# Patient Record
Sex: Female | Born: 1960 | Race: White | Hispanic: No | Marital: Single | Smoking: Current every day smoker
Health system: Southern US, Community
[De-identification: ages and names within clinical notes are randomized; demographics above are authoritative.]

## PROBLEM LIST (undated history)

## (undated) DIAGNOSIS — N393 Stress incontinence (female) (male): Secondary | ICD-10-CM

## (undated) DIAGNOSIS — F419 Anxiety disorder, unspecified: Secondary | ICD-10-CM

## (undated) DIAGNOSIS — M1990 Unspecified osteoarthritis, unspecified site: Secondary | ICD-10-CM

## (undated) HISTORY — DX: Anxiety disorder, unspecified: F41.9

## (undated) HISTORY — PX: JOINT REPLACEMENT: SHX530

## (undated) HISTORY — PX: TUBAL LIGATION: SHX77

## (undated) HISTORY — DX: Unspecified osteoarthritis, unspecified site: M19.90

---

## 2006-07-16 ENCOUNTER — Ambulatory Visit: Payer: Self-pay

## 2008-01-04 ENCOUNTER — Emergency Department: Payer: Self-pay

## 2012-06-24 ENCOUNTER — Ambulatory Visit: Payer: Self-pay

## 2012-06-30 ENCOUNTER — Ambulatory Visit: Payer: Self-pay

## 2013-12-01 ENCOUNTER — Emergency Department: Payer: Self-pay | Admitting: Emergency Medicine

## 2014-05-26 ENCOUNTER — Ambulatory Visit
Admit: 2014-05-26 | Disposition: A | Discharge status: Home / Self Care | Payer: Self-pay | Attending: Orthopedic Surgery | Admitting: Orthopedic Surgery

## 2015-06-29 ENCOUNTER — Ambulatory Visit: Payer: BLUE CROSS/BLUE SHIELD | Admitting: Internal Medicine

## 2015-06-29 DIAGNOSIS — Z0289 Encounter for other administrative examinations: Secondary | ICD-10-CM

## 2015-07-11 ENCOUNTER — Ambulatory Visit (INDEPENDENT_AMBULATORY_CARE_PROVIDER_SITE_OTHER): Payer: BLUE CROSS/BLUE SHIELD | Admitting: Internal Medicine

## 2015-07-11 ENCOUNTER — Encounter: Payer: Self-pay | Admitting: Internal Medicine

## 2015-07-11 VITALS — BP 120/74 | HR 80 | Temp 98.2°F | Ht 60.5 in | Wt 192.5 lb

## 2015-07-11 DIAGNOSIS — F411 Generalized anxiety disorder: Secondary | ICD-10-CM | POA: Insufficient documentation

## 2015-07-11 DIAGNOSIS — M170 Bilateral primary osteoarthritis of knee: Secondary | ICD-10-CM

## 2015-07-11 DIAGNOSIS — M129 Arthropathy, unspecified: Secondary | ICD-10-CM | POA: Diagnosis not present

## 2015-07-11 MED ORDER — BUSPIRONE HCL 7.5 MG PO TABS: 7.5000 mg | ORAL_TABLET | Freq: Three times a day (TID) | ORAL | Status: DC

## 2015-07-11 MED ORDER — MELOXICAM 15 MG PO TABS: 15.0000 mg | ORAL_TABLET | Freq: Every day | ORAL | Status: DC

## 2015-07-11 NOTE — Progress Notes (Signed)
HPI  Pt presents to the clinic today to establish care and for management of the conditions listed below. She is transferring care from Dr. Sammie Bench.  Arthritis: Mainly in her knees. She takes Meloxicam daily. 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 She has seen Dr. Rudene Christians in the past. He did xrays of her knees 1 month ago. She has had multiple cortisone injection in her knees bilaterally. He advised her to take Tylenol 1000 mg TID.  Anxiety: Triggered by stress of daily living, dealing with people every day. She reports her son was in an accident in 2003, and he is paralyzed from the waist down. She takes Xanax 1-2 times daily. She has never been treated with anything else in the past. She did see a counselor many years ago. She depression, SI/HI.  Past Medical History  Diagnosis Date  . Arthritis   . Anxiety     Current Outpatient Prescriptions  Medication Sig Dispense Refill  . acetaminophen (TYLENOL ARTHRITIS PAIN) 650 MG CR tablet Take 1,950 mg by mouth every 8 (eight) hours as needed for pain.    Marland Kitchen ALPRAZolam (XANAX) 1 MG tablet Take 1 mg by mouth 2 (two) times daily.   3  . Glucosamine-Chondroitin (OSTEO BI-FLEX REGULAR STRENGTH PO) Take 1 tablet by mouth daily.    . meloxicam (MOBIC) 15 MG tablet Take 15 mg by mouth daily.   4  . oxyCODONE-acetaminophen (PERCOCET/ROXICET) 5-325 MG tablet Take 1 tablet by mouth every 4 (four) hours as needed for severe pain.    Marland Kitchen ibuprofen (ADVIL,MOTRIN) 800 MG tablet Take 800 mg by mouth as needed. Reported on 07/11/2015  3   No current facility-administered medications for this visit.    No Known Allergies  Family History  Problem Relation Age of Onset  . Diabetes Father     Social History   Social History  . Marital Status: Single    Spouse Name: N/A  . Number of Children: N/A  . Years of Education: N/A   Occupational History  . Not on file.   Social History Main Topics  . Smoking status: Current Every Day  Smoker -- 0.50 packs/day    Types: Cigarettes  . Smokeless tobacco: Never Used  . Alcohol Use: No  . Drug Use: No  . Sexual Activity: Not on file   Other Topics Concern  . Not on file   Social History Narrative  . No narrative on file    ROS:  Constitutional: Denies fever, malaise, fatigue, headache or abrupt weight changes.  HEENT: Denies eye pain, eye redness, ear pain, ringing in the ears, wax buildup, runny nose, nasal congestion, bloody nose, or sore throat. Respiratory: Denies difficulty breathing, shortness of breath, cough or sputum production.   Cardiovascular: Denies chest pain, chest tightness, palpitations or swelling in the hands or feet.  Gastrointestinal: Denies abdominal pain, bloating, constipation, diarrhea or blood in the stool.  GU: Denies frequency, urgency, pain with urination, blood in urine, odor or discharge. Musculoskeletal: Pt reports joint pain. Denies decrease in range of motion, difficulty with gait, muscle pain or joint swelling.  Skin: Denies redness, rashes, lesions or ulcercations.  Neurological: Denies dizziness, difficulty with memory, difficulty with speech or problems with balance and coordination.  Psych: Pt reports anxiety. Denies depression, SI/HI.  No other specific complaints in a complete review of systems (except as listed in HPI above).  PE:  BP 120/74 mmHg  Pulse 80  Temp(Src) 98.2 F (36.8  C) (Oral)  Ht 5' 0.5" (1.537 m)  Wt 192 lb 8 oz (87.317 kg)  BMI 36.96 kg/m2  SpO2 97% Wt Readings from Last 3 Encounters:  07/11/15 192 lb 8 oz (87.317 kg)    General: Appears her stated age, obese in NAD. Cardiovascular: Normal rate and rhythm. S1,S2 noted.  No murmur, rubs or gallops noted.  Pulmonary/Chest: Normal effort and positive vesicular breath sounds. No respiratory distress. No wheezes, rales or ronchi noted.  Musculoskeletal:  Strength 5/5 BLE. No signs of joint swelling. No difficulty with gait.  Neurological: Alert and  oriented.  Psychiatric: Mood and affect flat.   Assessment and Plan:

## 2015-07-11 NOTE — Patient Instructions (Signed)
Generalized Anxiety Disorder Generalized anxiety disorder (GAD) is a mental disorder. It interferes with life functions, including relationships, work, and school. GAD is different from normal anxiety, which everyone experiences at some point in their lives in response to specific life events and activities. Normal anxiety actually helps us prepare for and get through these life events and activities. Normal anxiety goes away after the event or activity is over.  GAD causes anxiety that is not necessarily related to specific events or activities. It also causes excess anxiety in proportion to specific events or activities. The anxiety associated with GAD is also difficult to control. GAD can vary from mild to severe. People with severe GAD can have intense waves of anxiety with physical symptoms (panic attacks).  SYMPTOMS The anxiety and worry associated with GAD are difficult to control. This anxiety and worry are related to many life events and activities and also occur more days than not for 6 months or longer. People with GAD also have three or more of the following symptoms (one or more in children):  Restlessness.   Fatigue.  Difficulty concentrating.   Irritability.  Muscle tension.  Difficulty sleeping or unsatisfying sleep. DIAGNOSIS GAD is diagnosed through an assessment by your health care provider. Your health care provider will ask you questions aboutyour mood,physical symptoms, and events in your life. Your health care provider may ask you about your medical history and use of alcohol or drugs, including prescription medicines. Your health care provider may also do a physical exam and blood tests. Certain medical conditions and the use of certain substances can cause symptoms similar to those associated with GAD. Your health care provider may refer you to a mental health specialist for further evaluation. TREATMENT The following therapies are usually used to treat GAD:    Medication. Antidepressant medication usually is prescribed for long-term daily control. Antianxiety medicines may be added in severe cases, especially when panic attacks occur.   Talk therapy (psychotherapy). Certain types of talk therapy can be helpful in treating GAD by providing support, education, and guidance. A form of talk therapy called cognitive behavioral therapy can teach you healthy ways to think about and react to daily life events and activities.  Stress managementtechniques. These include yoga, meditation, and exercise and can be very helpful when they are practiced regularly. A mental health specialist can help determine which treatment is best for you. Some people see improvement with one therapy. However, other people require a combination of therapies.   This information is not intended to replace advice given to you by your health care provider. Make sure you discuss any questions you have with your health care provider.   Document Released: 06/08/2012 Document Revised: 03/04/2014 Document Reviewed: 06/08/2012 Elsevier Interactive Patient Education 2016 [employer] Inc[employer]  

## 2015-07-11 NOTE — Assessment & Plan Note (Signed)
She is following with Dr. Rudene Christians Advised her I would not give her a RX for Percocet Meloxicam refilled today Continue Tylenol as needed

## 2015-07-11 NOTE — Assessment & Plan Note (Signed)
Discussed the standard treatment for GAD Weaning schedule given for Xanax, I will not be providing her with a RX for Xanax eRx for Buspar 7.5 mg TID Support offered today  RTC in 1 month, sooner if needed

## 2015-07-12 ENCOUNTER — Telehealth: Payer: Self-pay | Admitting: *Deleted

## 2015-07-12 ENCOUNTER — Telehealth: Payer: Self-pay | Admitting: Internal Medicine

## 2015-07-12 NOTE — Telephone Encounter (Signed)
Pt is aware as instructed 

## 2015-07-12 NOTE — Telephone Encounter (Signed)
Spoke with pt, informed that her paper chart from Dr. Hardin Negus is ready for pickup per PCP

## 2015-07-12 NOTE — Telephone Encounter (Signed)
Spoke to pt who is requesting a call back discuss how she is to begin taking buspar. She is aware of how to wean off of xanax, but is questioning if she is supposed to take them together

## 2015-07-12 NOTE — Telephone Encounter (Signed)
Yes, she is supposed to take them together.

## 2015-07-21 ENCOUNTER — Telehealth: Payer: Self-pay

## 2015-07-21 NOTE — Telephone Encounter (Signed)
Have her cut it down to 1 tab every night x 1 week, then increase to 1 tab morning and night x 1 week and let me know how she is doing at that point.

## 2015-07-21 NOTE — Telephone Encounter (Signed)
Pt left v/m; pt was seen 07/11/15; buspirone is still making pt dizzy and pt wants to know what to do? Hyman Hopes.

## 2015-07-21 NOTE — Telephone Encounter (Signed)
Left message on voicemail.

## 2015-08-08 ENCOUNTER — Ambulatory Visit: Payer: BLUE CROSS/BLUE SHIELD | Admitting: Internal Medicine

## 2015-08-10 ENCOUNTER — Ambulatory Visit: Payer: BLUE CROSS/BLUE SHIELD | Admitting: Internal Medicine

## 2015-09-01 ENCOUNTER — Other Ambulatory Visit: Payer: Self-pay | Admitting: Adult Health

## 2015-09-01 DIAGNOSIS — Z1231 Encounter for screening mammogram for malignant neoplasm of breast: Secondary | ICD-10-CM

## 2015-09-13 ENCOUNTER — Other Ambulatory Visit: Payer: Self-pay | Admitting: Adult Health

## 2015-09-13 DIAGNOSIS — M545 Low back pain, unspecified: Secondary | ICD-10-CM

## 2015-09-13 DIAGNOSIS — G8929 Other chronic pain: Secondary | ICD-10-CM

## 2015-09-19 ENCOUNTER — Ambulatory Visit
Admission: RE | Admit: 2015-09-19 | Discharge: 2015-09-19 | Disposition: A | Discharge status: Home / Self Care | Payer: BLUE CROSS/BLUE SHIELD | Source: Ambulatory Visit | Attending: Adult Health | Admitting: Adult Health

## 2015-09-19 DIAGNOSIS — Z1231 Encounter for screening mammogram for malignant neoplasm of breast: Secondary | ICD-10-CM

## 2015-09-23 ENCOUNTER — Ambulatory Visit
Admission: RE | Admit: 2015-09-23 | Discharge: 2015-09-23 | Disposition: A | Discharge status: Home / Self Care | Payer: BLUE CROSS/BLUE SHIELD | Source: Ambulatory Visit | Attending: Adult Health | Admitting: Adult Health

## 2015-09-23 DIAGNOSIS — G8929 Other chronic pain: Secondary | ICD-10-CM

## 2015-09-23 DIAGNOSIS — M545 Low back pain, unspecified: Secondary | ICD-10-CM

## 2015-12-15 ENCOUNTER — Ambulatory Visit: Admit: 2015-12-15 | Payer: BLUE CROSS/BLUE SHIELD | Admitting: Unknown Physician Specialty

## 2015-12-15 SURGERY — COLONOSCOPY WITH PROPOFOL
Anesthesia: General

## 2016-02-27 ENCOUNTER — Encounter: Payer: Self-pay | Admitting: *Deleted

## 2016-02-28 ENCOUNTER — Ambulatory Visit: Payer: BLUE CROSS/BLUE SHIELD | Admitting: Certified Registered"

## 2016-02-28 ENCOUNTER — Encounter
Admission: RE | Disposition: A | Discharge status: Home / Self Care | Payer: Self-pay | Source: Ambulatory Visit | Attending: Unknown Physician Specialty

## 2016-02-28 ENCOUNTER — Ambulatory Visit
Admission: RE | Admit: 2016-02-28 | Discharge: 2016-02-28 | Disposition: A | Discharge status: Home / Self Care | Payer: BLUE CROSS/BLUE SHIELD | Source: Ambulatory Visit | Attending: Unknown Physician Specialty | Admitting: Unknown Physician Specialty

## 2016-02-28 ENCOUNTER — Encounter: Payer: Self-pay | Admitting: *Deleted

## 2016-02-28 DIAGNOSIS — F419 Anxiety disorder, unspecified: Secondary | ICD-10-CM | POA: Diagnosis not present

## 2016-02-28 DIAGNOSIS — D125 Benign neoplasm of sigmoid colon: Secondary | ICD-10-CM | POA: Diagnosis not present

## 2016-02-28 DIAGNOSIS — Z79899 Other long term (current) drug therapy: Secondary | ICD-10-CM | POA: Insufficient documentation

## 2016-02-28 DIAGNOSIS — F17210 Nicotine dependence, cigarettes, uncomplicated: Secondary | ICD-10-CM | POA: Insufficient documentation

## 2016-02-28 DIAGNOSIS — K219 Gastro-esophageal reflux disease without esophagitis: Secondary | ICD-10-CM | POA: Insufficient documentation

## 2016-02-28 DIAGNOSIS — J449 Chronic obstructive pulmonary disease, unspecified: Secondary | ICD-10-CM | POA: Diagnosis not present

## 2016-02-28 DIAGNOSIS — K621 Rectal polyp: Secondary | ICD-10-CM | POA: Diagnosis not present

## 2016-02-28 DIAGNOSIS — M1990 Unspecified osteoarthritis, unspecified site: Secondary | ICD-10-CM | POA: Diagnosis not present

## 2016-02-28 DIAGNOSIS — K640 First degree hemorrhoids: Secondary | ICD-10-CM | POA: Diagnosis not present

## 2016-02-28 DIAGNOSIS — Z1211 Encounter for screening for malignant neoplasm of colon: Secondary | ICD-10-CM | POA: Insufficient documentation

## 2016-02-28 HISTORY — DX: Stress incontinence (female) (male): N39.3

## 2016-02-28 HISTORY — PX: COLONOSCOPY WITH PROPOFOL: SHX5780

## 2016-02-28 SURGERY — COLONOSCOPY WITH PROPOFOL
Anesthesia: General

## 2016-02-28 MED ORDER — PROPOFOL 10 MG/ML IV BOLUS
INTRAVENOUS | Status: DC | PRN
  Administered 2016-02-28: 50 mg via INTRAVENOUS

## 2016-02-28 MED ORDER — LIDOCAINE HCL (CARDIAC) 20 MG/ML IV SOLN
INTRAVENOUS | Status: DC | PRN
  Administered 2016-02-28: 100 mg via INTRAVENOUS

## 2016-02-28 MED ORDER — LIDOCAINE 2% (20 MG/ML) 5 ML SYRINGE
INTRAMUSCULAR | Status: AC
  Filled 2016-02-28: qty 5

## 2016-02-28 MED ORDER — PROPOFOL 500 MG/50ML IV EMUL
INTRAVENOUS | Status: AC
  Filled 2016-02-28: qty 50

## 2016-02-28 MED ORDER — PROPOFOL 500 MG/50ML IV EMUL
INTRAVENOUS | Status: DC | PRN
  Administered 2016-02-28: 150 ug/kg/min via INTRAVENOUS

## 2016-02-28 MED ORDER — SODIUM CHLORIDE 0.9 % IV SOLN
INTRAVENOUS | Status: DC
  Administered 2016-02-28: 1000 mL via INTRAVENOUS
  Administered 2016-02-28 (×2): via INTRAVENOUS

## 2016-02-28 MED ORDER — SODIUM CHLORIDE 0.9 % IV SOLN: INTRAVENOUS | Status: DC

## 2016-02-28 MED ORDER — PROPOFOL 10 MG/ML IV BOLUS
INTRAVENOUS | Status: AC
  Filled 2016-02-28: qty 20

## 2016-02-28 NOTE — Anesthesia Preprocedure Evaluation (Signed)
Anesthesia Evaluation  Patient identified by MRN, date of birth, ID band Patient awake    Reviewed: Allergy & Precautions, H&P , NPO status , Patient's Chart, lab work & pertinent test results  History of Anesthesia Complications Negative for: history of anesthetic complications  Airway Mallampati: III  TM Distance: >3 FB Neck ROM: full    Dental  (+) Poor Dentition, Chipped, Missing, Partial Upper   Pulmonary neg shortness of breath, COPD, Current Smoker,    Pulmonary exam normal breath sounds clear to auscultation       Cardiovascular Exercise Tolerance: Good (-) angina(-) Past MI and (-) DOE negative cardio ROS Normal cardiovascular exam Rhythm:regular Rate:Normal     Neuro/Psych PSYCHIATRIC DISORDERS Anxiety negative neurological ROS     GI/Hepatic negative GI ROS, Neg liver ROS, neg GERD  ,  Endo/Other  negative endocrine ROS  Renal/GU negative Renal ROS  negative genitourinary   Musculoskeletal  (+) Arthritis ,   Abdominal   Peds  Hematology negative hematology ROS (+)   Anesthesia Other Findings Past Medical History: No date: Anxiety No date: Arthritis No date: Stress incontinence  Past Surgical History: No date: TUBAL LIGATION  BMI    Body Mass Index:  34.02 kg/m      Reproductive/Obstetrics negative OB ROS                             Anesthesia Physical Anesthesia Plan  ASA: III  Anesthesia Plan: General   Post-op Pain Management:    Induction:   Airway Management Planned:   Additional Equipment:   Intra-op Plan:   Post-operative Plan:   Informed Consent: I have reviewed the patients History and Physical, chart, labs and discussed the procedure including the risks, benefits and alternatives for the proposed anesthesia with the patient or authorized representative who has indicated his/her understanding and acceptance.   Dental Advisory Given  Plan  Discussed with: Anesthesiologist, CRNA and Surgeon  Anesthesia Plan Comments:         Anesthesia Quick Evaluation

## 2016-02-28 NOTE — Transfer of Care (Signed)
Immediate Anesthesia Transfer of Care Note  Patient: Victoria Bautista  Procedure(s) Performed: Procedure(s): COLONOSCOPY WITH PROPOFOL (N/A)  Patient Location: Endoscopy Unit  Anesthesia Type:General  Level of Consciousness: awake, oriented and patient cooperative  Airway & Oxygen Therapy: Patient Spontanous Breathing and Patient connected to nasal cannula oxygen  Post-op Assessment: Report given to RN, Post -op Vital signs reviewed and stable and Patient moving all extremities X 4  Post vital signs: Reviewed and stable  Last Vitals:  Vitals:   02/28/16 1014 02/28/16 1110  BP: (!) 110/96   Pulse: 86   Resp: 20   Temp: 36.1 C (P) 36.2 C    Last Pain:  Vitals:   02/28/16 1110  TempSrc: (P) Tympanic         Complications: No apparent anesthesia complications

## 2016-02-28 NOTE — Op Note (Signed)
[facility] Patient Name: Victoria Bautista Procedure Date: 02/28/2016 10:41 AM MRN: [id number] Account #: [id number] Date of Birth: 10-25-1960 Admit Type: Outpatient Age: 56 Room: [facility] ENDO ROOM 4 Gender: Female Note Status: Finalized Procedure:            Colonoscopy Indications:          Screening for colorectal malignant neoplasm Providers:            Manya Silvas, MD Referring MD:         Alvester Chou (Referring MD) Medicines:            Propofol per Anesthesia Complications:        No immediate complications. Procedure:            Pre-Anesthesia Assessment:                       - After reviewing the risks and benefits, the patient                        was deemed in satisfactory condition to undergo the                        procedure.                       After obtaining informed consent, the colonoscope was                        passed under direct vision. Throughout the procedure,                        the patient's blood pressure, pulse, and oxygen                        saturations were monitored continuously. The                        Colonoscope was introduced through the anus and                        advanced to the the cecum, identified by appendiceal                        orifice and ileocecal valve. The colonoscopy was                        performed without difficulty. The patient tolerated the                        procedure well. The quality of the bowel preparation                        was excellent. Findings:      A small polyp was found in the sigmoid colon. The polyp was sessile. The       polyp was removed with a hot snare. Resection and retrieval were       complete.      Three sessile polyps were found in the rectum. The polyps were       diminutive in size. These polyps were removed with a jumbo cold forceps.  Resection and retrieval were complete.      Internal hemorrhoids were found during endoscopy.  The hemorrhoids were       small, medium-sized and Grade I (internal hemorrhoids that do not       prolapse).      The exam was otherwise without abnormality.      External hemorrhoids were found during endoscopy. The hemorrhoids were       medium-sized. Impression:           - One small polyp in the sigmoid colon, removed with a                        hot snare. Resected and retrieved.                       - Three diminutive polyps in the rectum, removed with a                        jumbo cold forceps. Resected and retrieved.                       - Internal hemorrhoids.                       - The examination was otherwise normal. Recommendation:       - Await pathology results. Manya Silvas, MD 02/28/2016 11:13:23 AM This report has been signed electronically. Number of Addenda: 0 Note Initiated On: 02/28/2016 10:41 AM Scope Withdrawal Time: 0 hours 12 minutes 46 seconds  Total Procedure Duration: 0 hours 19 minutes 39 seconds       [facility]

## 2016-02-28 NOTE — H&P (Signed)
Primary Care Physician:  PROVIDER NOT Long Primary Gastroenterologist:  Dr. Vira Agar  Pre-Procedure History & Physical: HPI:  Victoria Bautista is a 56 y.o. female is here for an colonoscopy.   Past Medical History:  Diagnosis Date  . Anxiety   . Arthritis   . Stress incontinence     Past Surgical History:  Procedure Laterality Date  . TUBAL LIGATION      Prior to Admission medications   Medication Sig Start Date End Date Taking? Authorizing Provider  ALPRAZolam Duanne Moron) 1 MG tablet Take 1 mg by mouth 2 (two) times daily.  06/14/15  Yes Historical Provider, MD  cyclobenzaprine (FLEXERIL) 10 MG tablet Take 10 mg by mouth 3 (three) times daily as needed for muscle spasms.   Yes Historical Provider, MD  Multiple Vitamin (MULTIVITAMIN) tablet Take 1 tablet by mouth daily.   Yes Historical Provider, MD  OMEGA 3-6-9 FATTY ACIDS PO Take 1 capsule by mouth daily.   Yes Historical Provider, MD  oxyCODONE-acetaminophen (PERCOCET) 7.5-325 MG tablet Take 1 tablet by mouth every 4 (four) hours as needed for severe pain.   Yes Historical Provider, MD  acetaminophen (TYLENOL ARTHRITIS PAIN) 650 MG CR tablet Take 1,950 mg by mouth every 8 (eight) hours as needed for pain.    Historical Provider, MD  Glucosamine-Chondroitin (OSTEO BI-FLEX REGULAR STRENGTH PO) Take 1 tablet by mouth daily.    Historical Provider, MD  ibuprofen (ADVIL,MOTRIN) 800 MG tablet Take 800 mg by mouth as needed. Reported on 07/11/2015 07/06/15   Historical Provider, MD  meloxicam (MOBIC) 15 MG tablet Take 1 tablet (15 mg total) by mouth daily. 07/11/15   Jearld Fenton, NP    Allergies as of 10/19/2015  . (No Known Allergies)    Family History  Problem Relation Age of Onset  . Diabetes Father   . Stroke Father   . Cancer Neg Hx   . Heart disease Neg Hx     Social History   Social History  . Marital status: Single    Spouse name: N/A  . Number of children: N/A  . Years of education: N/A   Occupational History  .  Not on file.   Social History Main Topics  . Smoking status: Current Every Day Smoker    Packs/day: 0.50    Years: 35.00    Types: Cigarettes  . Smokeless tobacco: Never Used  . Alcohol use No  . Drug use: No  . Sexual activity: Not Currently   Other Topics Concern  . Not on file   Social History Narrative  . No narrative on file    Review of Systems: See HPI, otherwise negative ROS  Physical Exam: BP (!) 110/96   Pulse 86   Temp 97 F (36.1 C) (Tympanic)   Resp 20   Ht 5' 1.5" (1.562 m)   Wt 83 kg (183 lb)   SpO2 95%   BMI 34.02 kg/m  General:   Alert,  pleasant and cooperative in NAD Head:  Normocephalic and atraumatic. Neck:  Supple; no masses or thyromegaly. Lungs:  Clear throughout to auscultation.    Heart:  Regular rate and rhythm. Abdomen:  Soft, nontender and nondistended. Normal bowel sounds, without guarding, and without rebound.   Neurologic:  Alert and  oriented x4;  grossly normal neurologically.  Impression/Plan: Victoria Bautista is here for an colonoscopy to be performed for colon screening  Risks, benefits, limitations, and alternatives regarding  colonoscopy have been reviewed with the patient.  Questions have been answered.  All parties agreeable.   Gaylyn Cheers, MD  02/28/2016, 10:38 AM

## 2016-02-28 NOTE — Anesthesia Postprocedure Evaluation (Signed)
Anesthesia Post Note  Patient: Victoria Bautista  Procedure(s) Performed: Procedure(s) (LRB): COLONOSCOPY WITH PROPOFOL (N/A)  Patient location during evaluation: Endoscopy Anesthesia Type: General Level of consciousness: awake and alert Pain management: pain level controlled Vital Signs Assessment: post-procedure vital signs reviewed and stable Respiratory status: spontaneous breathing, nonlabored ventilation, respiratory function stable and patient connected to nasal cannula oxygen Cardiovascular status: blood pressure returned to baseline and stable Postop Assessment: no signs of nausea or vomiting Anesthetic complications: no     Last Vitals:  Vitals:   02/28/16 1110 02/28/16 1120  BP: 111/72 111/72  Pulse:    Resp:    Temp: 36.2 C     Last Pain:  Vitals:   02/28/16 1130  TempSrc:   PainSc: 0-No pain                 Precious Haws Germain Koopmann

## 2016-02-29 ENCOUNTER — Encounter: Payer: Self-pay | Admitting: Unknown Physician Specialty

## 2016-02-29 LAB — SURGICAL PATHOLOGY

## 2016-04-24 ENCOUNTER — Other Ambulatory Visit: Payer: Self-pay | Admitting: Orthopedic Surgery

## 2016-04-24 DIAGNOSIS — M25461 Effusion, right knee: Secondary | ICD-10-CM

## 2016-04-24 DIAGNOSIS — M25561 Pain in right knee: Secondary | ICD-10-CM

## 2016-06-12 ENCOUNTER — Encounter
Admission: RE | Admit: 2016-06-12 | Discharge: 2016-06-12 | Disposition: A | Discharge status: Home / Self Care | Payer: BLUE CROSS/BLUE SHIELD | Source: Ambulatory Visit | Attending: Orthopedic Surgery | Admitting: Orthopedic Surgery

## 2016-06-12 NOTE — Patient Instructions (Signed)
  Your procedure is scheduled on: 06-20-16 Report to Same Day Surgery 2nd floor medical mall [facility] Entrance-take elevator on left to 2nd floor.  Check in with surgery information desk.) To find out your arrival time please call [phone] between 1PM - 3PM on 06-19-16  Remember: Instructions that are not followed completely may result in serious medical risk, up to and including death, or upon the discretion of your surgeon and anesthesiologist your surgery may need to be rescheduled.    _x___ 1. Do not eat food or drink liquids after midnight. No gum chewing or                              hard candies.     __x__ 2. No Alcohol for 24 hours before or after surgery.   __x__3. No Smoking for 24 prior to surgery.   ____  4. Bring all medications with you on the day of surgery if instructed.    __x__ 5. Notify your doctor if there is any change in your medical condition     (cold, fever, infections).     Do not wear jewelry, make-up, hairpins, clips or nail polish.  Do not wear lotions, powders, or perfumes. You may wear deodorant.  Do not shave 48 hours prior to surgery. Men may shave face and neck.  Do not bring valuables to the hospital.    [facility] is not responsible for any belongings or valuables.               Contacts, dentures or bridgework may not be worn into surgery.  Leave your suitcase in the car. After surgery it may be brought to your room.  For patients admitted to the hospital, discharge time is determined by your                       treatment team.   Patients discharged the day of surgery will not be allowed to drive home.  You will need someone to drive you home and stay with you the night of your procedure.    Please read over the following fact sheets that you were given:   [facility] Preparing for Surgery and or MRSA Information   _x___ Take anti-hypertensive (unless it includes a diuretic), cardiac, seizure, asthma,     anti-reflux and psychiatric  medicines. These include:  1. XANAX  2.  3.  4.  5.  6.  ____Fleets enema or Magnesium Citrate as directed.   ____ Use CHG Soap or sage wipes as directed on instruction sheet   ____ Use inhalers on the day of surgery and bring to hospital day of surgery  ____ Stop Metformin and Janumet 2 days prior to surgery.    ____ Take 1/2 of usual insulin dose the night before surgery and none on the morning     surgery.   ____ Follow recommendations from Cardiologist, Pulmonologist or PCP regarding          stopping Aspirin, Coumadin, Pllavix ,Eliquis, Effient, or Pradaxa, and Pletal.  X____Stop Anti-inflammatories such as Advil, Aleve, Ibuprofen, Motrin, Naproxen, MELOXICAM Naprosyn, Goodies powders or aspirin products NOW-OK to take Tylenol OR HYDROCODONE   _x___ Stop supplements until after surgery-STOP FISH OIL, GLUCOSAMINE-CHONDROITIN, VIT E, CO-Q 10,  AND CRANBERRY NOW    ____ Bring C-Pap to the hospital.

## 2016-06-19 MED ORDER — CEFAZOLIN SODIUM-DEXTROSE 2-4 GM/100ML-% IV SOLN
2.0000 g | Freq: Once | INTRAVENOUS | Status: AC
Start: 2016-06-19 — End: 2016-06-20
  Administered 2016-06-20: 2 g via INTRAVENOUS

## 2016-06-19 MED ORDER — FAMOTIDINE 20 MG PO TABS
20.0000 mg | ORAL_TABLET | Freq: Once | ORAL | Status: AC
  Administered 2016-06-20: 20 mg via ORAL

## 2016-06-20 ENCOUNTER — Encounter
Admission: RE | Disposition: A | Discharge status: Home / Self Care | Payer: Self-pay | Source: Ambulatory Visit | Attending: Orthopedic Surgery

## 2016-06-20 ENCOUNTER — Ambulatory Visit: Payer: BLUE CROSS/BLUE SHIELD | Admitting: Anesthesiology

## 2016-06-20 ENCOUNTER — Ambulatory Visit
Admission: RE | Admit: 2016-06-20 | Discharge: 2016-06-20 | Disposition: A | Discharge status: Home / Self Care | Payer: BLUE CROSS/BLUE SHIELD | Source: Ambulatory Visit | Attending: Orthopedic Surgery | Admitting: Orthopedic Surgery

## 2016-06-20 ENCOUNTER — Encounter: Payer: Self-pay | Admitting: *Deleted

## 2016-06-20 DIAGNOSIS — Z791 Long term (current) use of non-steroidal anti-inflammatories (NSAID): Secondary | ICD-10-CM | POA: Insufficient documentation

## 2016-06-20 DIAGNOSIS — S83231A Complex tear of medial meniscus, current injury, right knee, initial encounter: Secondary | ICD-10-CM | POA: Diagnosis not present

## 2016-06-20 DIAGNOSIS — F419 Anxiety disorder, unspecified: Secondary | ICD-10-CM | POA: Insufficient documentation

## 2016-06-20 DIAGNOSIS — F17200 Nicotine dependence, unspecified, uncomplicated: Secondary | ICD-10-CM | POA: Insufficient documentation

## 2016-06-20 DIAGNOSIS — X58XXXA Exposure to other specified factors, initial encounter: Secondary | ICD-10-CM | POA: Insufficient documentation

## 2016-06-20 DIAGNOSIS — M659 Synovitis and tenosynovitis, unspecified: Secondary | ICD-10-CM | POA: Insufficient documentation

## 2016-06-20 DIAGNOSIS — M23300 Other meniscus derangements, unspecified lateral meniscus, right knee: Secondary | ICD-10-CM | POA: Diagnosis not present

## 2016-06-20 DIAGNOSIS — Z79899 Other long term (current) drug therapy: Secondary | ICD-10-CM | POA: Diagnosis not present

## 2016-06-20 DIAGNOSIS — M170 Bilateral primary osteoarthritis of knee: Secondary | ICD-10-CM | POA: Diagnosis not present

## 2016-06-20 DIAGNOSIS — M25561 Pain in right knee: Secondary | ICD-10-CM | POA: Diagnosis present

## 2016-06-20 HISTORY — PX: KNEE ARTHROSCOPY: SHX127

## 2016-06-20 LAB — POCT PREGNANCY, URINE: PREG TEST UR: NEGATIVE

## 2016-06-20 SURGERY — ARTHROSCOPY, KNEE
Anesthesia: General | Site: Knee | Laterality: Right | Wound class: Clean

## 2016-06-20 MED ORDER — HYDROCODONE-ACETAMINOPHEN 5-325 MG PO TABS
1.0000 | ORAL_TABLET | ORAL | Status: DC | PRN
  Administered 2016-06-20: 2 via ORAL

## 2016-06-20 MED ORDER — CEFAZOLIN SODIUM-DEXTROSE 2-4 GM/100ML-% IV SOLN
INTRAVENOUS | Status: AC
  Filled 2016-06-20: qty 100

## 2016-06-20 MED ORDER — ACETAMINOPHEN 10 MG/ML IV SOLN
INTRAVENOUS | Status: AC
  Filled 2016-06-20: qty 100

## 2016-06-20 MED ORDER — PROPOFOL 10 MG/ML IV BOLUS
INTRAVENOUS | Status: DC | PRN
Start: 2016-06-20 — End: 2016-06-20
  Administered 2016-06-20: 180 mg via INTRAVENOUS
  Administered 2016-06-20: 20 mg via INTRAVENOUS

## 2016-06-20 MED ORDER — ONDANSETRON HCL 4 MG/2ML IJ SOLN
INTRAMUSCULAR | Status: DC | PRN
Start: 2016-06-20 — End: 2016-06-20
  Administered 2016-06-20: 4 mg via INTRAVENOUS

## 2016-06-20 MED ORDER — FENTANYL CITRATE (PF) 100 MCG/2ML IJ SOLN
INTRAMUSCULAR | Status: AC
  Administered 2016-06-20: 25 ug via INTRAVENOUS
  Filled 2016-06-20: qty 2

## 2016-06-20 MED ORDER — MIDAZOLAM HCL 2 MG/2ML IJ SOLN
INTRAMUSCULAR | Status: AC
  Filled 2016-06-20: qty 2

## 2016-06-20 MED ORDER — ONDANSETRON HCL 4 MG/2ML IJ SOLN: 4.0000 mg | Freq: Four times a day (QID) | INTRAMUSCULAR | Status: DC | PRN

## 2016-06-20 MED ORDER — HYDROCODONE-ACETAMINOPHEN 5-325 MG PO TABS
ORAL_TABLET | ORAL | Status: AC
  Administered 2016-06-20: 2 via ORAL
  Filled 2016-06-20: qty 2

## 2016-06-20 MED ORDER — HYDROCODONE-ACETAMINOPHEN 5-325 MG PO TABS: 1.0000 | ORAL_TABLET | Freq: Four times a day (QID) | ORAL | 0 refills | Status: DC | PRN

## 2016-06-20 MED ORDER — DEXAMETHASONE SODIUM PHOSPHATE 10 MG/ML IJ SOLN
INTRAMUSCULAR | Status: DC | PRN
  Administered 2016-06-20: 8 mg via INTRAVENOUS

## 2016-06-20 MED ORDER — METOCLOPRAMIDE HCL 5 MG/ML IJ SOLN: 5.0000 mg | Freq: Three times a day (TID) | INTRAMUSCULAR | Status: DC | PRN

## 2016-06-20 MED ORDER — BUPIVACAINE-EPINEPHRINE (PF) 0.5% -1:200000 IJ SOLN
INTRAMUSCULAR | Status: DC | PRN
  Administered 2016-06-20: 20 mL

## 2016-06-20 MED ORDER — ACETAMINOPHEN 10 MG/ML IV SOLN
INTRAVENOUS | Status: DC | PRN
  Administered 2016-06-20: 1000 mg via INTRAVENOUS

## 2016-06-20 MED ORDER — PROPOFOL 10 MG/ML IV BOLUS
INTRAVENOUS | Status: AC
  Filled 2016-06-20: qty 20

## 2016-06-20 MED ORDER — SODIUM CHLORIDE 0.9 % IV SOLN: INTRAVENOUS | Status: DC

## 2016-06-20 MED ORDER — FENTANYL CITRATE (PF) 100 MCG/2ML IJ SOLN
INTRAMUSCULAR | Status: AC
  Filled 2016-06-20: qty 2

## 2016-06-20 MED ORDER — FAMOTIDINE 20 MG PO TABS
ORAL_TABLET | ORAL | Status: AC
Start: 2016-06-20 — End: 2016-06-20
  Administered 2016-06-20: 20 mg via ORAL
  Filled 2016-06-20: qty 1

## 2016-06-20 MED ORDER — LIDOCAINE HCL 2 % EX GEL
CUTANEOUS | Status: AC
  Filled 2016-06-20: qty 5

## 2016-06-20 MED ORDER — ONDANSETRON HCL 4 MG PO TABS: 4.0000 mg | ORAL_TABLET | Freq: Four times a day (QID) | ORAL | Status: DC | PRN

## 2016-06-20 MED ORDER — FENTANYL CITRATE (PF) 100 MCG/2ML IJ SOLN
25.0000 ug | INTRAMUSCULAR | Status: DC
  Administered 2016-06-20 (×2): 25 ug via INTRAVENOUS

## 2016-06-20 MED ORDER — LIDOCAINE HCL (CARDIAC) 20 MG/ML IV SOLN
INTRAVENOUS | Status: DC | PRN
  Administered 2016-06-20: 40 mg via INTRAVENOUS

## 2016-06-20 MED ORDER — BUPIVACAINE-EPINEPHRINE (PF) 0.5% -1:200000 IJ SOLN
INTRAMUSCULAR | Status: AC
  Filled 2016-06-20: qty 30

## 2016-06-20 MED ORDER — METOCLOPRAMIDE HCL 10 MG PO TABS: 5.0000 mg | ORAL_TABLET | Freq: Three times a day (TID) | ORAL | Status: DC | PRN

## 2016-06-20 MED ORDER — FENTANYL CITRATE (PF) 100 MCG/2ML IJ SOLN
25.0000 ug | INTRAMUSCULAR | Status: AC | PRN
  Administered 2016-06-20 (×6): 25 ug via INTRAVENOUS

## 2016-06-20 MED ORDER — FENTANYL CITRATE (PF) 100 MCG/2ML IJ SOLN
INTRAMUSCULAR | Status: DC | PRN
  Administered 2016-06-20 (×4): 50 ug via INTRAVENOUS

## 2016-06-20 MED ORDER — MIDAZOLAM HCL 2 MG/2ML IJ SOLN
INTRAMUSCULAR | Status: DC | PRN
  Administered 2016-06-20: 2 mg via INTRAVENOUS

## 2016-06-20 MED ORDER — PROPOFOL 10 MG/ML IV BOLUS: INTRAVENOUS | Status: DC | PRN

## 2016-06-20 MED ORDER — LACTATED RINGERS IV SOLN
INTRAVENOUS | Status: DC
  Administered 2016-06-20 (×2): via INTRAVENOUS

## 2016-06-20 MED ORDER — PROMETHAZINE HCL 25 MG/ML IJ SOLN
6.2500 mg | INTRAMUSCULAR | Status: DC | PRN
Start: 2016-06-20 — End: 2016-06-20

## 2016-06-20 SURGICAL SUPPLY — 26 items
BANDAGE ACE 4X5 VEL STRL LF (GAUZE/BANDAGES/DRESSINGS) ×3 IMPLANT
BLADE INCISOR PLUS 4.5 (BLADE) ×3 IMPLANT
CHLORAPREP W/TINT 26ML (MISCELLANEOUS) ×3 IMPLANT
CUFF TOURN 24 STER (MISCELLANEOUS) IMPLANT
CUFF TOURN 30 STER DUAL PORT (MISCELLANEOUS) ×3 IMPLANT
DRAPE C-ARM XRAY 36X54 (DRAPES) IMPLANT
DRAPE C-ARMOR (DRAPES) IMPLANT
GAUZE SPONGE 4X4 12PLY STRL (GAUZE/BANDAGES/DRESSINGS) ×3 IMPLANT
GLOVE SURG SYN 9.0  PF PI (GLOVE) ×4
GLOVE SURG SYN 9.0 PF PI (GLOVE) ×2 IMPLANT
GOWN SRG 2XL LVL 4 RGLN SLV (GOWNS) ×1 IMPLANT
GOWN STRL NON-REIN 2XL LVL4 (GOWNS) ×2
GOWN STRL REUS W/ TWL LRG LVL3 (GOWN DISPOSABLE) ×1 IMPLANT
GOWN STRL REUS W/TWL LRG LVL3 (GOWN DISPOSABLE) ×2
IV LACTATED RINGER IRRG 3000ML (IV SOLUTION) ×4
IV LR IRRIG 3000ML ARTHROMATIC (IV SOLUTION) ×2 IMPLANT
KIT RM TURNOVER STRD PROC AR (KITS) ×3 IMPLANT
MANIFOLD NEPTUNE II (INSTRUMENTS) ×3 IMPLANT
PACK ARTHROSCOPY KNEE (MISCELLANEOUS) ×3 IMPLANT
SET TUBE SUCT SHAVER OUTFL 24K (TUBING) ×3 IMPLANT
SET TUBE TIP INTRA-ARTICULAR (MISCELLANEOUS) ×3 IMPLANT
SUT ETHILON 4-0 (SUTURE) ×2
SUT ETHILON 4-0 FS2 18XMFL BLK (SUTURE) ×1
SUTURE ETHLN 4-0 FS2 18XMF BLK (SUTURE) ×1 IMPLANT
TUBING ARTHRO INFLOW-ONLY STRL (TUBING) ×3 IMPLANT
WAND COBLATION FLOW 50 (SURGICAL WAND) ×3 IMPLANT

## 2016-06-20 NOTE — Anesthesia Preprocedure Evaluation (Signed)
Anesthesia Evaluation  Patient identified by MRN, date of birth, ID band Patient awake    Reviewed: Allergy & Precautions, H&P , NPO status , Patient's Chart, lab work & pertinent test results, reviewed documented beta blocker date and time   History of Anesthesia Complications Negative for: history of anesthetic complications  Airway Mallampati: II  TM Distance: >3 FB Neck ROM: full    Dental  (+) Partial Upper, Teeth Intact   Pulmonary neg shortness of breath, neg sleep apnea, neg COPD, neg recent URI, Current Smoker,           Cardiovascular Exercise Tolerance: Good negative cardio ROS       Neuro/Psych PSYCHIATRIC DISORDERS (Anxiety) negative neurological ROS     GI/Hepatic negative GI ROS, Neg liver ROS,   Endo/Other  negative endocrine ROS  Renal/GU negative Renal ROS  negative genitourinary   Musculoskeletal   Abdominal   Peds  Hematology negative hematology ROS (+)   Anesthesia Other Findings Past Medical History: No date: Anxiety No date: Arthritis No date: Stress incontinence   Reproductive/Obstetrics negative OB ROS                             Anesthesia Physical Anesthesia Plan  ASA: II  Anesthesia Plan: General   Post-op Pain Management:    Induction:   Airway Management Planned:   Additional Equipment:   Intra-op Plan:   Post-operative Plan:   Informed Consent: I have reviewed the patients History and Physical, chart, labs and discussed the procedure including the risks, benefits and alternatives for the proposed anesthesia with the patient or authorized representative who has indicated his/her understanding and acceptance.   Dental Advisory Given  Plan Discussed with: Anesthesiologist, CRNA and Surgeon  Anesthesia Plan Comments:         Anesthesia Quick Evaluation

## 2016-06-20 NOTE — Anesthesia Procedure Notes (Signed)
Procedure Name: LMA Insertion Date/Time: 06/20/2016 8:35 AM Performed by: Allean Found Pre-anesthesia Checklist: Patient identified, Emergency Drugs available, Suction available, Patient being monitored and Timeout performed Patient Re-evaluated:Patient Re-evaluated prior to inductionOxygen Delivery Method: Circle system utilized Preoxygenation: Pre-oxygenation with 100% oxygen Intubation Type: IV induction Ventilation: Mask ventilation without difficulty LMA: LMA inserted LMA Size: 4.0 Number of attempts: 1 Placement Confirmation: ETT inserted through vocal cords under direct vision,  positive ETCO2 and breath sounds checked- equal and bilateral Tube secured with: Tape Dental Injury: Teeth and Oropharynx as per pre-operative assessment

## 2016-06-20 NOTE — Anesthesia Post-op Follow-up Note (Cosign Needed)
Anesthesia QCDR form completed.        

## 2016-06-20 NOTE — Transfer of Care (Signed)
Immediate Anesthesia Transfer of Care Note  Patient: Victoria Bautista  Procedure(s) Performed: Procedure(s): right knee arthroscopy, partial and lateral menisectomy, medial femoral chondroplasty, loose body removal (Right)  Patient Location: PACU  Anesthesia Type:General  Level of Consciousness: sedated  Airway & Oxygen Therapy: Patient Spontanous Breathing and Patient connected to face mask oxygen  Post-op Assessment: Report given to RN and Post -op Vital signs reviewed and stable  Post vital signs: Reviewed and stable  Last Vitals:  Vitals:   06/20/16 0736 06/20/16 0932  BP: 134/80 134/82  Pulse: 83 67  Resp: 18 11  Temp: 36.8 C 36.5 C    Last Pain:  Vitals:   06/20/16 0736  TempSrc: Oral  PainSc: 4          Complications: No apparent anesthesia complications

## 2016-06-20 NOTE — Discharge Instructions (Addendum)
Keep dressing clean and dry, if bandage slides down the leg remove entire bandage. Band-Aids on the 2 incisions and reapply Ace wrap only. Pain medicine as directed. Aspirin 325 mg daily until walking normally. Minimize activities through the weekend     [place]   1) The drugs that you were given will stay in your system until tomorrow so for the next 24 hours you should not:  A) Drive an automobile B) Make any legal decisions C) Drink any alcoholic beverage   2) You may resume regular meals tomorrow.  Today it is better to start with liquids and gradually work up to solid foods.  You may eat anything you prefer, but it is better to start with liquids, then soup and crackers, and gradually work up to solid foods.   3) Please notify your doctor immediately if you have any unusual bleeding, trouble breathing, redness and pain at the surgery site, drainage, fever, or pain not relieved by medication.    4) Additional Instructions:        Please contact your physician with any problems or Same Day Surgery at [phone], Monday through Friday 6 am to 4 pm, or [place] at [facility] number at [phone].

## 2016-06-20 NOTE — H&P (Signed)
Reviewed paper H+P, will be scanned into chart. Patient examined No changes noted.  

## 2016-06-20 NOTE — Op Note (Signed)
06/20/2016  9:33 AM  PATIENT:  Victoria Bautista  56 y.o. female  PRE-OPERATIVE DIAGNOSIS:  derangement of lateral meniscus of right knee, medial meniscus tear, arthritis  POST-OPERATIVE DIAGNOSIS:  derangement of lateral meniscus of right knee, same with loose body  PROCEDURE:  Procedure(s): right knee arthroscopy, partial and lateral menisectomy, medial femoral chondroplasty, loose body removal (Right)  SURGEON: Laurene Footman, MD  ASSISTANTS: None  ANESTHESIA:   general  EBL:  Total I/O In: 500 [I.V.:500] Out: -   BLOOD ADMINISTERED:none  DRAINS: none   LOCAL MEDICATIONS USED:  MARCAINE     SPECIMEN:  No Specimen  DISPOSITION OF SPECIMEN:  N/A  COUNTS:  YES  TOURNIQUET:    IMPLANTS: None  DICTATION: .Dragon Dictation patient was brought to the operating room and after adequate anesthesia was obtained, the right leg was placed in the arthroscopic leg holder with tourniquet applied. After prepping and draping in the usual sterile fashion, appropriate patient identification and timeout procedures were completed. An inferior lateral portal was made and the arthroscope was introduced. Initial inspection revealed mild synovitis throughout the knee moderate patellofemoral degenerative change central patella and trochlea with normal tracking coming around medial compartment inferior medial portal was made and on probing there is a tear at the junction of middle and posterior thirds consistent with prior MRI this is debrided with meniscal punch followed by ArthroCare wand to get back to stable margin with mild degenerative changes to the medial compartment. Anterior cruciate ligament was intact going lateral compartment appeared there been a bucket handle tear of the lateral posterior meniscus involving the posterior and middle thirds tendon separated and there are 2 large flaps these were debrided with use of the ArthroCare wand getting back to stable margins circumferentially additionally  there is significant defect to the central tibial articular cartilage. On the medial side the medial femoral condyle had extensive grade 2 and 3 changes and this was smoothed over most of the femoral condyle with the use of the ArthroCare wand at this point in the posterior lateral aspect of the knee and the lateral compartment a loose body was noted and this was removed without difficulty the knee was then irrigated until clear and all argentation withdrawn. The wounds were closed simple interrupted 4-0 nylon followed by Xeroform 4 x 4 web roll and Ace wrap after 20 cc of half percent Sensorcaine) infiltrated near the incisions for postop analgesia.  PLAN OF CARE: Discharge to home after PACU  PATIENT DISPOSITION:  PACU - hemodynamically stable.

## 2016-06-21 NOTE — Anesthesia Postprocedure Evaluation (Signed)
Anesthesia Post Note  Patient: Cora Brierley  Procedure(s) Performed: Procedure(s) (LRB): right knee arthroscopy, partial and lateral menisectomy, medial femoral chondroplasty, loose body removal (Right)  Patient location during evaluation: PACU Anesthesia Type: General Level of consciousness: awake and alert Pain management: pain level controlled Vital Signs Assessment: post-procedure vital signs reviewed and stable Respiratory status: spontaneous breathing, nonlabored ventilation, respiratory function stable and patient connected to nasal cannula oxygen Cardiovascular status: blood pressure returned to baseline and stable Postop Assessment: no signs of nausea or vomiting Anesthetic complications: no     Last Vitals:  Vitals:   06/20/16 1105 06/20/16 1151  BP: 139/84 135/84  Pulse: (!) 59   Resp: (!) 22   Temp: 36.2 C     Last Pain:  Vitals:   06/21/16 0825  TempSrc:   PainSc: 1                  Martha Clan

## 2016-09-24 ENCOUNTER — Other Ambulatory Visit: Payer: Self-pay | Admitting: Adult Health

## 2016-09-24 DIAGNOSIS — Z1231 Encounter for screening mammogram for malignant neoplasm of breast: Secondary | ICD-10-CM

## 2016-09-25 ENCOUNTER — Other Ambulatory Visit: Payer: Self-pay | Admitting: Adult Health

## 2016-09-25 DIAGNOSIS — R5381 Other malaise: Secondary | ICD-10-CM

## 2016-10-18 ENCOUNTER — Other Ambulatory Visit: Payer: Self-pay | Admitting: Adult Health

## 2016-10-18 DIAGNOSIS — E559 Vitamin D deficiency, unspecified: Secondary | ICD-10-CM

## 2016-10-31 ENCOUNTER — Inpatient Hospital Stay
Admission: RE | Admit: 2016-10-31 | Discharge: 2016-10-31 | Disposition: A | Discharge status: Home / Self Care | Payer: BLUE CROSS/BLUE SHIELD | Source: Ambulatory Visit | Attending: Adult Health | Admitting: Adult Health

## 2016-10-31 ENCOUNTER — Ambulatory Visit
Admission: RE | Admit: 2016-10-31 | Discharge: 2016-10-31 | Disposition: A | Discharge status: Home / Self Care | Payer: BLUE CROSS/BLUE SHIELD | Source: Ambulatory Visit | Attending: Adult Health | Admitting: Adult Health

## 2016-10-31 DIAGNOSIS — E559 Vitamin D deficiency, unspecified: Secondary | ICD-10-CM

## 2016-10-31 DIAGNOSIS — Z1231 Encounter for screening mammogram for malignant neoplasm of breast: Secondary | ICD-10-CM

## 2017-12-17 ENCOUNTER — Other Ambulatory Visit: Payer: Self-pay | Admitting: Adult Health

## 2017-12-17 DIAGNOSIS — Z1231 Encounter for screening mammogram for malignant neoplasm of breast: Secondary | ICD-10-CM

## 2018-02-02 ENCOUNTER — Ambulatory Visit
Admission: RE | Admit: 2018-02-02 | Discharge: 2018-02-02 | Disposition: A | Discharge status: Home / Self Care | Payer: BLUE CROSS/BLUE SHIELD | Source: Ambulatory Visit | Attending: Adult Health | Admitting: Adult Health

## 2018-02-02 DIAGNOSIS — Z1231 Encounter for screening mammogram for malignant neoplasm of breast: Secondary | ICD-10-CM

## 2019-01-20 ENCOUNTER — Other Ambulatory Visit: Payer: Self-pay | Admitting: Adult Health

## 2019-01-20 DIAGNOSIS — Z1231 Encounter for screening mammogram for malignant neoplasm of breast: Secondary | ICD-10-CM

## 2019-03-16 ENCOUNTER — Ambulatory Visit
Admission: RE | Admit: 2019-03-16 | Discharge: 2019-03-16 | Disposition: A | Discharge status: Home / Self Care | Payer: 59 | Source: Ambulatory Visit | Attending: *Deleted | Admitting: *Deleted

## 2019-03-16 ENCOUNTER — Other Ambulatory Visit: Payer: Self-pay

## 2019-03-16 DIAGNOSIS — Z1231 Encounter for screening mammogram for malignant neoplasm of breast: Secondary | ICD-10-CM

## 2021-04-12 DIAGNOSIS — M531 Cervicobrachial syndrome: Secondary | ICD-10-CM | POA: Diagnosis not present

## 2021-04-12 DIAGNOSIS — M9906 Segmental and somatic dysfunction of lower extremity: Secondary | ICD-10-CM | POA: Diagnosis not present

## 2021-04-12 DIAGNOSIS — M25561 Pain in right knee: Secondary | ICD-10-CM | POA: Diagnosis not present

## 2021-04-12 DIAGNOSIS — M546 Pain in thoracic spine: Secondary | ICD-10-CM | POA: Diagnosis not present

## 2021-04-12 DIAGNOSIS — M9902 Segmental and somatic dysfunction of thoracic region: Secondary | ICD-10-CM | POA: Diagnosis not present

## 2021-04-12 DIAGNOSIS — M955 Acquired deformity of pelvis: Secondary | ICD-10-CM | POA: Diagnosis not present

## 2021-04-12 DIAGNOSIS — M9901 Segmental and somatic dysfunction of cervical region: Secondary | ICD-10-CM | POA: Diagnosis not present

## 2021-04-12 DIAGNOSIS — M9905 Segmental and somatic dysfunction of pelvic region: Secondary | ICD-10-CM | POA: Diagnosis not present

## 2021-04-12 DIAGNOSIS — M9903 Segmental and somatic dysfunction of lumbar region: Secondary | ICD-10-CM | POA: Diagnosis not present

## 2021-04-12 DIAGNOSIS — M5137 Other intervertebral disc degeneration, lumbosacral region: Secondary | ICD-10-CM | POA: Diagnosis not present

## 2021-04-19 DIAGNOSIS — M955 Acquired deformity of pelvis: Secondary | ICD-10-CM | POA: Diagnosis not present

## 2021-04-19 DIAGNOSIS — M9903 Segmental and somatic dysfunction of lumbar region: Secondary | ICD-10-CM | POA: Diagnosis not present

## 2021-04-19 DIAGNOSIS — M9906 Segmental and somatic dysfunction of lower extremity: Secondary | ICD-10-CM | POA: Diagnosis not present

## 2021-04-19 DIAGNOSIS — M9902 Segmental and somatic dysfunction of thoracic region: Secondary | ICD-10-CM | POA: Diagnosis not present

## 2021-04-19 DIAGNOSIS — M546 Pain in thoracic spine: Secondary | ICD-10-CM | POA: Diagnosis not present

## 2021-04-19 DIAGNOSIS — M531 Cervicobrachial syndrome: Secondary | ICD-10-CM | POA: Diagnosis not present

## 2021-04-19 DIAGNOSIS — M9905 Segmental and somatic dysfunction of pelvic region: Secondary | ICD-10-CM | POA: Diagnosis not present

## 2021-04-19 DIAGNOSIS — M9901 Segmental and somatic dysfunction of cervical region: Secondary | ICD-10-CM | POA: Diagnosis not present

## 2021-04-19 DIAGNOSIS — M5137 Other intervertebral disc degeneration, lumbosacral region: Secondary | ICD-10-CM | POA: Diagnosis not present

## 2021-04-19 DIAGNOSIS — M25561 Pain in right knee: Secondary | ICD-10-CM | POA: Diagnosis not present

## 2021-04-20 IMAGING — MG DIGITAL SCREENING BILAT W/ CAD
4 series · 4 of 4 positions shown · non-contrast
Comparison: Previous exam(s).

CLINICAL DATA: Screening.

EXAM:
DIGITAL SCREENING BILATERAL MAMMOGRAM WITH CAD

[R CC]
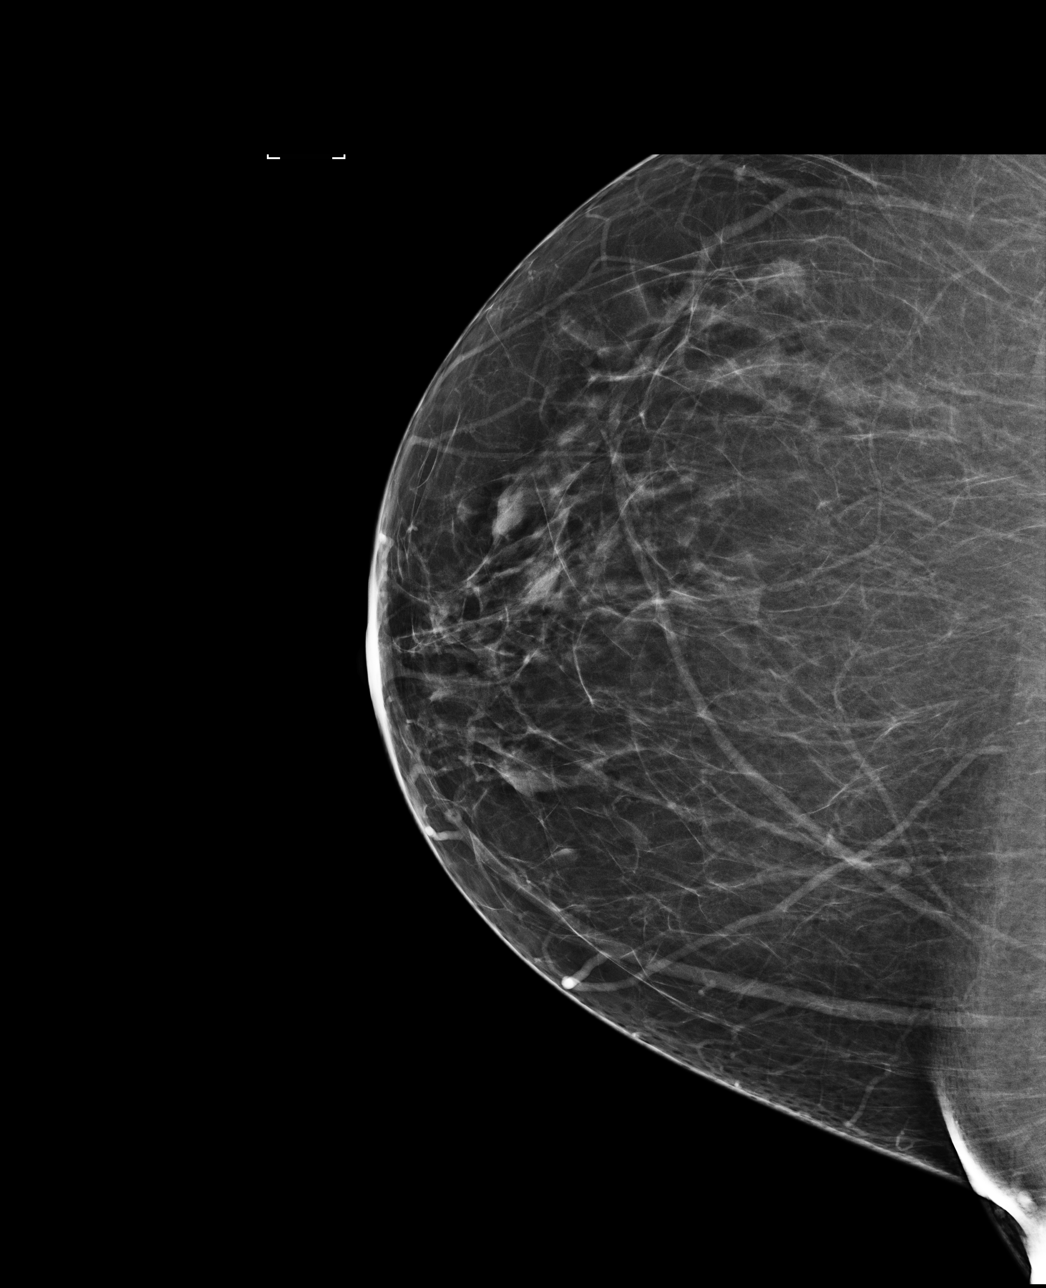

[L CC]
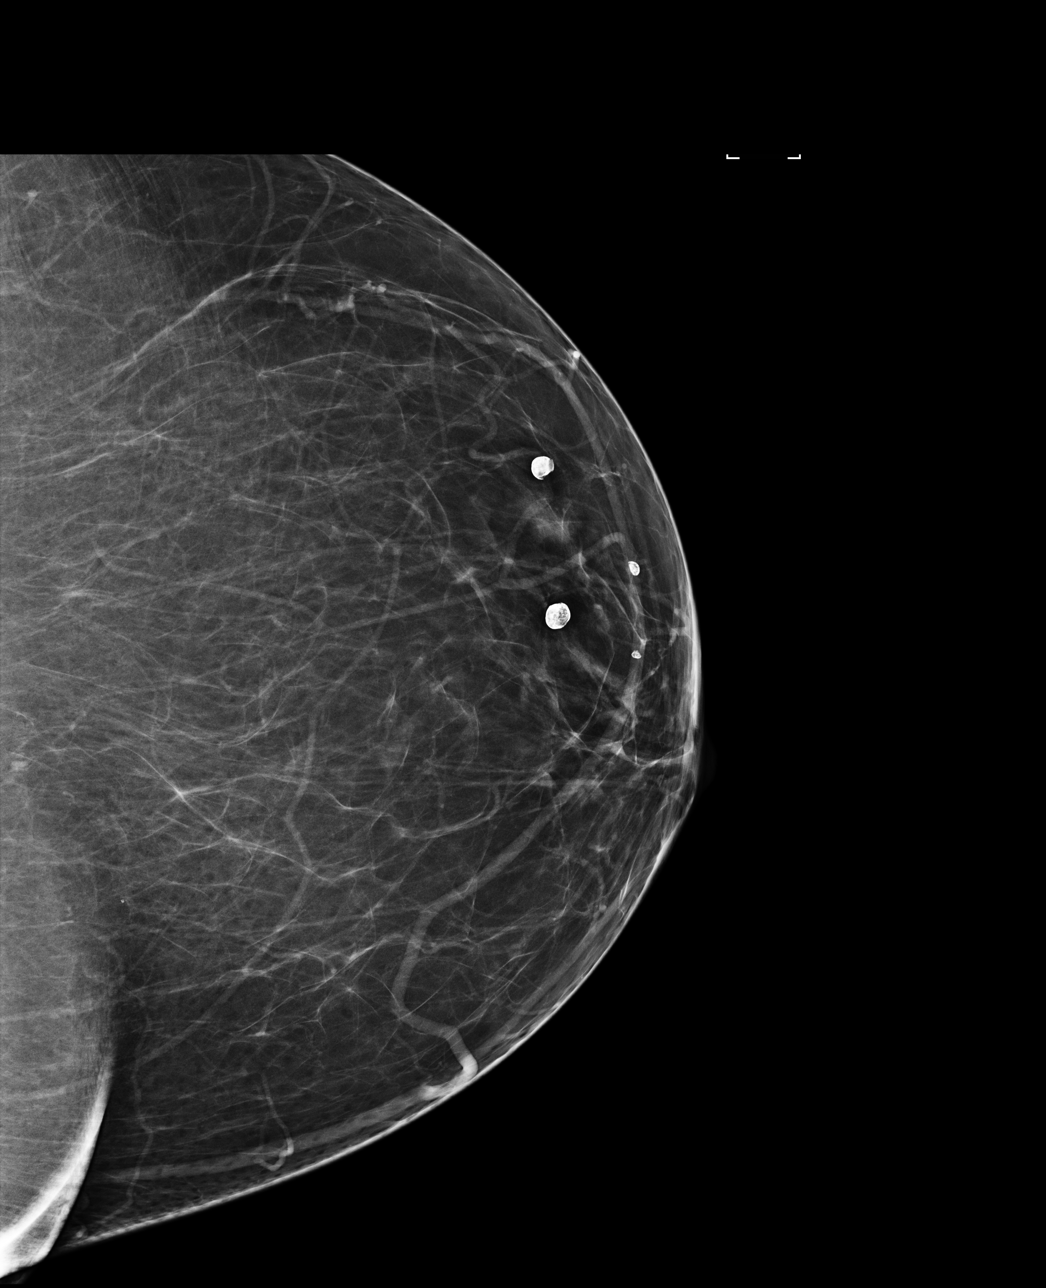

[L MLO]
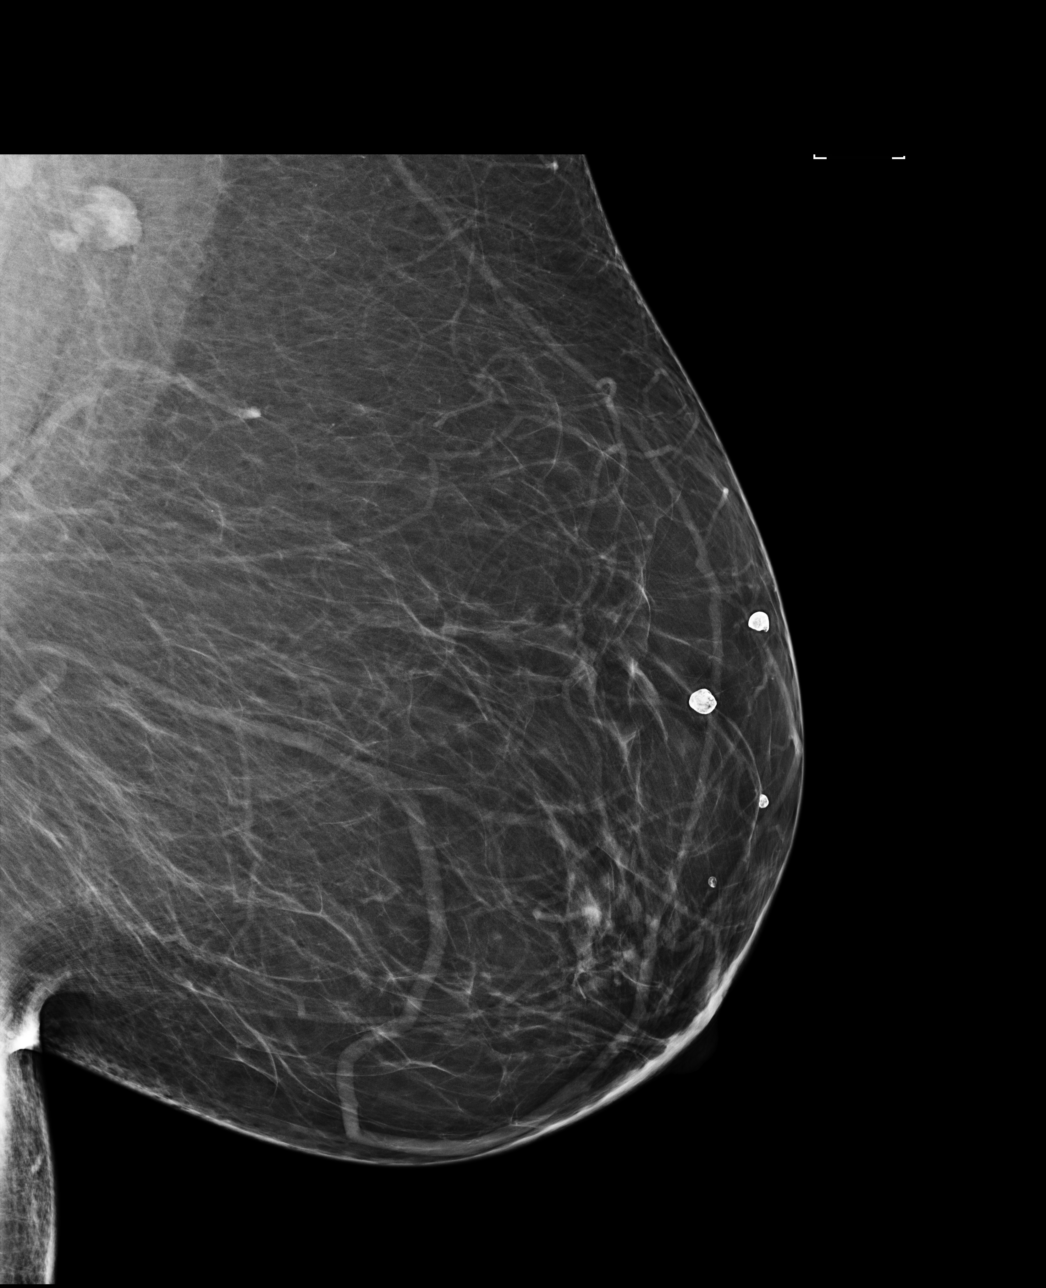

[R MLO]
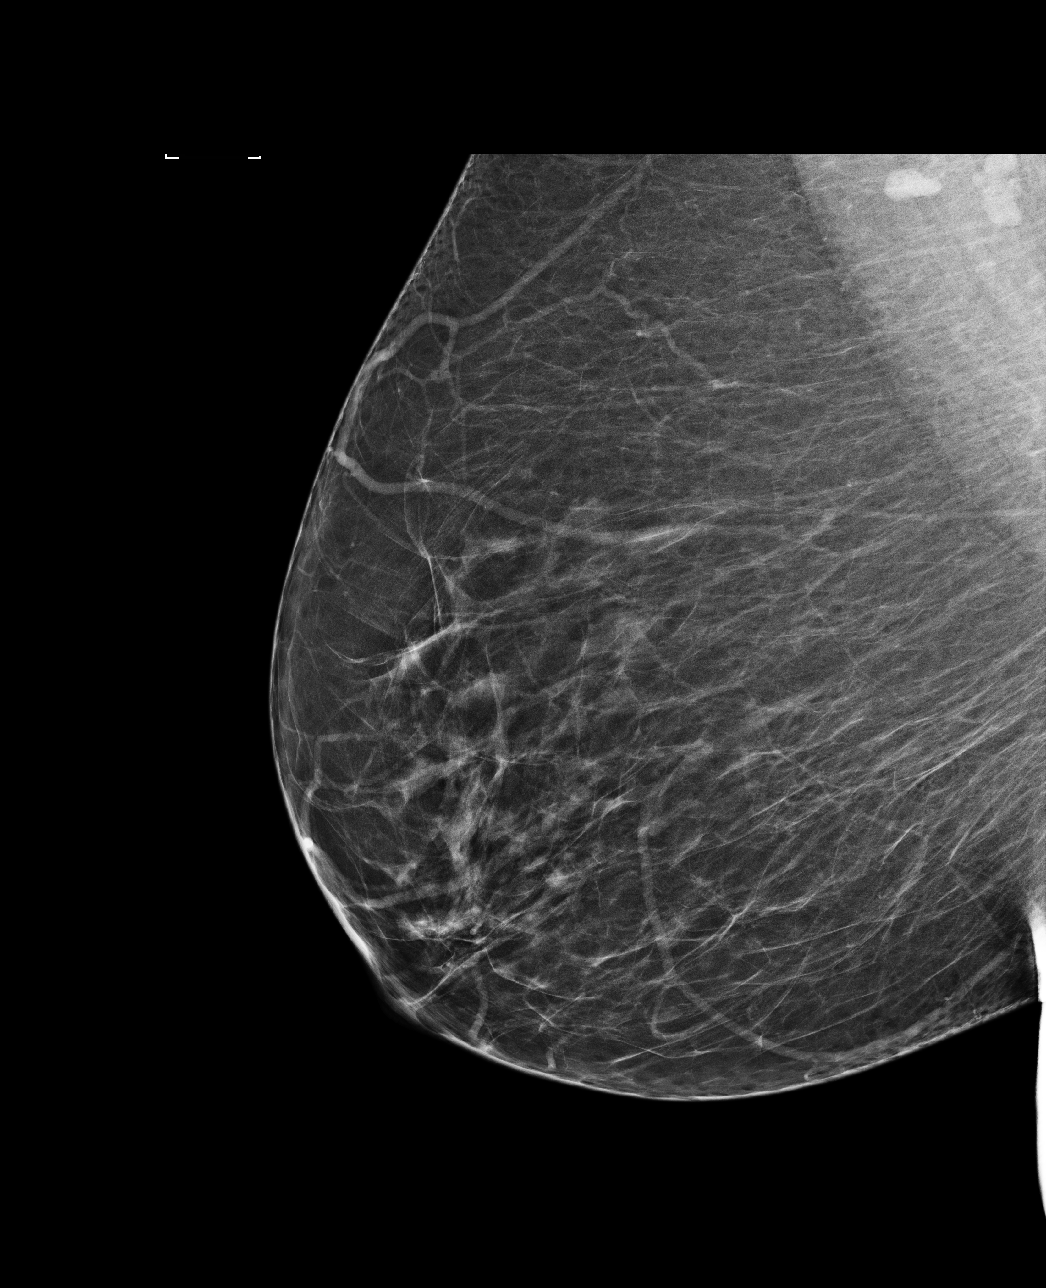

[4 of 4 positions shown; findings below may reference images not displayed]

ACR Breast Density Category b: There are scattered areas of
fibroglandular density.
FINDINGS: There are no findings suspicious for malignancy. Images were
processed with CAD.
IMPRESSION: No mammographic evidence of malignancy. A result letter of this
screening mammogram will be mailed directly to the patient.

RECOMMENDATION:
Screening mammogram in one year. (Code:AS-G-LCT)

BI-RADS CATEGORY  1: Negative.

## 2021-05-03 DIAGNOSIS — M25561 Pain in right knee: Secondary | ICD-10-CM | POA: Diagnosis not present

## 2021-05-03 DIAGNOSIS — M9905 Segmental and somatic dysfunction of pelvic region: Secondary | ICD-10-CM | POA: Diagnosis not present

## 2021-05-03 DIAGNOSIS — M955 Acquired deformity of pelvis: Secondary | ICD-10-CM | POA: Diagnosis not present

## 2021-05-03 DIAGNOSIS — M9902 Segmental and somatic dysfunction of thoracic region: Secondary | ICD-10-CM | POA: Diagnosis not present

## 2021-05-03 DIAGNOSIS — M9903 Segmental and somatic dysfunction of lumbar region: Secondary | ICD-10-CM | POA: Diagnosis not present

## 2021-05-03 DIAGNOSIS — M531 Cervicobrachial syndrome: Secondary | ICD-10-CM | POA: Diagnosis not present

## 2021-05-03 DIAGNOSIS — M546 Pain in thoracic spine: Secondary | ICD-10-CM | POA: Diagnosis not present

## 2021-05-03 DIAGNOSIS — M9906 Segmental and somatic dysfunction of lower extremity: Secondary | ICD-10-CM | POA: Diagnosis not present

## 2021-05-03 DIAGNOSIS — M5137 Other intervertebral disc degeneration, lumbosacral region: Secondary | ICD-10-CM | POA: Diagnosis not present

## 2021-05-03 DIAGNOSIS — M9901 Segmental and somatic dysfunction of cervical region: Secondary | ICD-10-CM | POA: Diagnosis not present

## 2021-05-10 DIAGNOSIS — M531 Cervicobrachial syndrome: Secondary | ICD-10-CM | POA: Diagnosis not present

## 2021-05-10 DIAGNOSIS — M9906 Segmental and somatic dysfunction of lower extremity: Secondary | ICD-10-CM | POA: Diagnosis not present

## 2021-05-10 DIAGNOSIS — M9903 Segmental and somatic dysfunction of lumbar region: Secondary | ICD-10-CM | POA: Diagnosis not present

## 2021-05-10 DIAGNOSIS — M546 Pain in thoracic spine: Secondary | ICD-10-CM | POA: Diagnosis not present

## 2021-05-10 DIAGNOSIS — M9905 Segmental and somatic dysfunction of pelvic region: Secondary | ICD-10-CM | POA: Diagnosis not present

## 2021-05-10 DIAGNOSIS — M955 Acquired deformity of pelvis: Secondary | ICD-10-CM | POA: Diagnosis not present

## 2021-05-10 DIAGNOSIS — M9902 Segmental and somatic dysfunction of thoracic region: Secondary | ICD-10-CM | POA: Diagnosis not present

## 2021-05-10 DIAGNOSIS — M5137 Other intervertebral disc degeneration, lumbosacral region: Secondary | ICD-10-CM | POA: Diagnosis not present

## 2021-05-10 DIAGNOSIS — M25561 Pain in right knee: Secondary | ICD-10-CM | POA: Diagnosis not present

## 2021-05-10 DIAGNOSIS — M9901 Segmental and somatic dysfunction of cervical region: Secondary | ICD-10-CM | POA: Diagnosis not present

## 2021-05-17 DIAGNOSIS — M531 Cervicobrachial syndrome: Secondary | ICD-10-CM | POA: Diagnosis not present

## 2021-05-17 DIAGNOSIS — M9906 Segmental and somatic dysfunction of lower extremity: Secondary | ICD-10-CM | POA: Diagnosis not present

## 2021-05-17 DIAGNOSIS — M5137 Other intervertebral disc degeneration, lumbosacral region: Secondary | ICD-10-CM | POA: Diagnosis not present

## 2021-05-17 DIAGNOSIS — M9902 Segmental and somatic dysfunction of thoracic region: Secondary | ICD-10-CM | POA: Diagnosis not present

## 2021-05-17 DIAGNOSIS — M25561 Pain in right knee: Secondary | ICD-10-CM | POA: Diagnosis not present

## 2021-05-17 DIAGNOSIS — M9901 Segmental and somatic dysfunction of cervical region: Secondary | ICD-10-CM | POA: Diagnosis not present

## 2021-05-17 DIAGNOSIS — M546 Pain in thoracic spine: Secondary | ICD-10-CM | POA: Diagnosis not present

## 2021-05-17 DIAGNOSIS — M9903 Segmental and somatic dysfunction of lumbar region: Secondary | ICD-10-CM | POA: Diagnosis not present

## 2021-05-17 DIAGNOSIS — M955 Acquired deformity of pelvis: Secondary | ICD-10-CM | POA: Diagnosis not present

## 2021-05-17 DIAGNOSIS — M9905 Segmental and somatic dysfunction of pelvic region: Secondary | ICD-10-CM | POA: Diagnosis not present

## 2021-05-24 DIAGNOSIS — M9902 Segmental and somatic dysfunction of thoracic region: Secondary | ICD-10-CM | POA: Diagnosis not present

## 2021-05-24 DIAGNOSIS — M25561 Pain in right knee: Secondary | ICD-10-CM | POA: Diagnosis not present

## 2021-05-24 DIAGNOSIS — M9903 Segmental and somatic dysfunction of lumbar region: Secondary | ICD-10-CM | POA: Diagnosis not present

## 2021-05-24 DIAGNOSIS — M955 Acquired deformity of pelvis: Secondary | ICD-10-CM | POA: Diagnosis not present

## 2021-05-24 DIAGNOSIS — M546 Pain in thoracic spine: Secondary | ICD-10-CM | POA: Diagnosis not present

## 2021-05-24 DIAGNOSIS — M531 Cervicobrachial syndrome: Secondary | ICD-10-CM | POA: Diagnosis not present

## 2021-05-24 DIAGNOSIS — M9906 Segmental and somatic dysfunction of lower extremity: Secondary | ICD-10-CM | POA: Diagnosis not present

## 2021-05-24 DIAGNOSIS — M5137 Other intervertebral disc degeneration, lumbosacral region: Secondary | ICD-10-CM | POA: Diagnosis not present

## 2021-05-24 DIAGNOSIS — M9901 Segmental and somatic dysfunction of cervical region: Secondary | ICD-10-CM | POA: Diagnosis not present

## 2021-05-24 DIAGNOSIS — M9905 Segmental and somatic dysfunction of pelvic region: Secondary | ICD-10-CM | POA: Diagnosis not present

## 2021-05-28 DIAGNOSIS — M170 Bilateral primary osteoarthritis of knee: Secondary | ICD-10-CM | POA: Diagnosis not present

## 2021-05-31 DIAGNOSIS — M9906 Segmental and somatic dysfunction of lower extremity: Secondary | ICD-10-CM | POA: Diagnosis not present

## 2021-05-31 DIAGNOSIS — M9905 Segmental and somatic dysfunction of pelvic region: Secondary | ICD-10-CM | POA: Diagnosis not present

## 2021-05-31 DIAGNOSIS — M546 Pain in thoracic spine: Secondary | ICD-10-CM | POA: Diagnosis not present

## 2021-05-31 DIAGNOSIS — M5137 Other intervertebral disc degeneration, lumbosacral region: Secondary | ICD-10-CM | POA: Diagnosis not present

## 2021-05-31 DIAGNOSIS — M955 Acquired deformity of pelvis: Secondary | ICD-10-CM | POA: Diagnosis not present

## 2021-05-31 DIAGNOSIS — M25561 Pain in right knee: Secondary | ICD-10-CM | POA: Diagnosis not present

## 2021-05-31 DIAGNOSIS — M9902 Segmental and somatic dysfunction of thoracic region: Secondary | ICD-10-CM | POA: Diagnosis not present

## 2021-05-31 DIAGNOSIS — M9901 Segmental and somatic dysfunction of cervical region: Secondary | ICD-10-CM | POA: Diagnosis not present

## 2021-05-31 DIAGNOSIS — M531 Cervicobrachial syndrome: Secondary | ICD-10-CM | POA: Diagnosis not present

## 2021-05-31 DIAGNOSIS — M9903 Segmental and somatic dysfunction of lumbar region: Secondary | ICD-10-CM | POA: Diagnosis not present

## 2021-06-14 DIAGNOSIS — M25561 Pain in right knee: Secondary | ICD-10-CM | POA: Diagnosis not present

## 2021-06-14 DIAGNOSIS — M9905 Segmental and somatic dysfunction of pelvic region: Secondary | ICD-10-CM | POA: Diagnosis not present

## 2021-06-14 DIAGNOSIS — M9901 Segmental and somatic dysfunction of cervical region: Secondary | ICD-10-CM | POA: Diagnosis not present

## 2021-06-14 DIAGNOSIS — M9906 Segmental and somatic dysfunction of lower extremity: Secondary | ICD-10-CM | POA: Diagnosis not present

## 2021-06-14 DIAGNOSIS — M531 Cervicobrachial syndrome: Secondary | ICD-10-CM | POA: Diagnosis not present

## 2021-06-14 DIAGNOSIS — M9902 Segmental and somatic dysfunction of thoracic region: Secondary | ICD-10-CM | POA: Diagnosis not present

## 2021-06-14 DIAGNOSIS — M955 Acquired deformity of pelvis: Secondary | ICD-10-CM | POA: Diagnosis not present

## 2021-06-14 DIAGNOSIS — M9903 Segmental and somatic dysfunction of lumbar region: Secondary | ICD-10-CM | POA: Diagnosis not present

## 2021-06-14 DIAGNOSIS — M5137 Other intervertebral disc degeneration, lumbosacral region: Secondary | ICD-10-CM | POA: Diagnosis not present

## 2021-06-14 DIAGNOSIS — M546 Pain in thoracic spine: Secondary | ICD-10-CM | POA: Diagnosis not present

## 2021-06-18 DIAGNOSIS — M25561 Pain in right knee: Secondary | ICD-10-CM | POA: Diagnosis not present

## 2021-06-18 DIAGNOSIS — M546 Pain in thoracic spine: Secondary | ICD-10-CM | POA: Diagnosis not present

## 2021-06-18 DIAGNOSIS — M9906 Segmental and somatic dysfunction of lower extremity: Secondary | ICD-10-CM | POA: Diagnosis not present

## 2021-06-18 DIAGNOSIS — M9901 Segmental and somatic dysfunction of cervical region: Secondary | ICD-10-CM | POA: Diagnosis not present

## 2021-06-18 DIAGNOSIS — M531 Cervicobrachial syndrome: Secondary | ICD-10-CM | POA: Diagnosis not present

## 2021-06-18 DIAGNOSIS — M9902 Segmental and somatic dysfunction of thoracic region: Secondary | ICD-10-CM | POA: Diagnosis not present

## 2021-06-18 DIAGNOSIS — M9903 Segmental and somatic dysfunction of lumbar region: Secondary | ICD-10-CM | POA: Diagnosis not present

## 2021-06-18 DIAGNOSIS — M955 Acquired deformity of pelvis: Secondary | ICD-10-CM | POA: Diagnosis not present

## 2021-06-18 DIAGNOSIS — M5137 Other intervertebral disc degeneration, lumbosacral region: Secondary | ICD-10-CM | POA: Diagnosis not present

## 2021-06-18 DIAGNOSIS — M9905 Segmental and somatic dysfunction of pelvic region: Secondary | ICD-10-CM | POA: Diagnosis not present

## 2021-06-26 DIAGNOSIS — M170 Bilateral primary osteoarthritis of knee: Secondary | ICD-10-CM | POA: Diagnosis not present

## 2021-06-28 DIAGNOSIS — M25561 Pain in right knee: Secondary | ICD-10-CM | POA: Diagnosis not present

## 2021-06-28 DIAGNOSIS — M25562 Pain in left knee: Secondary | ICD-10-CM | POA: Diagnosis not present

## 2021-06-28 DIAGNOSIS — M9906 Segmental and somatic dysfunction of lower extremity: Secondary | ICD-10-CM | POA: Diagnosis not present

## 2021-06-28 DIAGNOSIS — M5137 Other intervertebral disc degeneration, lumbosacral region: Secondary | ICD-10-CM | POA: Diagnosis not present

## 2021-06-28 DIAGNOSIS — M9902 Segmental and somatic dysfunction of thoracic region: Secondary | ICD-10-CM | POA: Diagnosis not present

## 2021-06-28 DIAGNOSIS — M531 Cervicobrachial syndrome: Secondary | ICD-10-CM | POA: Diagnosis not present

## 2021-06-28 DIAGNOSIS — M9903 Segmental and somatic dysfunction of lumbar region: Secondary | ICD-10-CM | POA: Diagnosis not present

## 2021-06-28 DIAGNOSIS — M9905 Segmental and somatic dysfunction of pelvic region: Secondary | ICD-10-CM | POA: Diagnosis not present

## 2021-06-28 DIAGNOSIS — M546 Pain in thoracic spine: Secondary | ICD-10-CM | POA: Diagnosis not present

## 2021-06-28 DIAGNOSIS — M9901 Segmental and somatic dysfunction of cervical region: Secondary | ICD-10-CM | POA: Diagnosis not present

## 2021-06-28 DIAGNOSIS — M955 Acquired deformity of pelvis: Secondary | ICD-10-CM | POA: Diagnosis not present

## 2021-07-05 DIAGNOSIS — M9901 Segmental and somatic dysfunction of cervical region: Secondary | ICD-10-CM | POA: Diagnosis not present

## 2021-07-05 DIAGNOSIS — M546 Pain in thoracic spine: Secondary | ICD-10-CM | POA: Diagnosis not present

## 2021-07-05 DIAGNOSIS — M25562 Pain in left knee: Secondary | ICD-10-CM | POA: Diagnosis not present

## 2021-07-05 DIAGNOSIS — M9905 Segmental and somatic dysfunction of pelvic region: Secondary | ICD-10-CM | POA: Diagnosis not present

## 2021-07-05 DIAGNOSIS — M531 Cervicobrachial syndrome: Secondary | ICD-10-CM | POA: Diagnosis not present

## 2021-07-05 DIAGNOSIS — M955 Acquired deformity of pelvis: Secondary | ICD-10-CM | POA: Diagnosis not present

## 2021-07-05 DIAGNOSIS — M9903 Segmental and somatic dysfunction of lumbar region: Secondary | ICD-10-CM | POA: Diagnosis not present

## 2021-07-05 DIAGNOSIS — M25561 Pain in right knee: Secondary | ICD-10-CM | POA: Diagnosis not present

## 2021-07-05 DIAGNOSIS — M9906 Segmental and somatic dysfunction of lower extremity: Secondary | ICD-10-CM | POA: Diagnosis not present

## 2021-07-05 DIAGNOSIS — M5137 Other intervertebral disc degeneration, lumbosacral region: Secondary | ICD-10-CM | POA: Diagnosis not present

## 2021-07-05 DIAGNOSIS — M9902 Segmental and somatic dysfunction of thoracic region: Secondary | ICD-10-CM | POA: Diagnosis not present

## 2021-07-19 DIAGNOSIS — M9901 Segmental and somatic dysfunction of cervical region: Secondary | ICD-10-CM | POA: Diagnosis not present

## 2021-07-19 DIAGNOSIS — M9905 Segmental and somatic dysfunction of pelvic region: Secondary | ICD-10-CM | POA: Diagnosis not present

## 2021-07-19 DIAGNOSIS — M9902 Segmental and somatic dysfunction of thoracic region: Secondary | ICD-10-CM | POA: Diagnosis not present

## 2021-07-19 DIAGNOSIS — M5137 Other intervertebral disc degeneration, lumbosacral region: Secondary | ICD-10-CM | POA: Diagnosis not present

## 2021-07-19 DIAGNOSIS — M955 Acquired deformity of pelvis: Secondary | ICD-10-CM | POA: Diagnosis not present

## 2021-07-19 DIAGNOSIS — M9906 Segmental and somatic dysfunction of lower extremity: Secondary | ICD-10-CM | POA: Diagnosis not present

## 2021-07-19 DIAGNOSIS — M9903 Segmental and somatic dysfunction of lumbar region: Secondary | ICD-10-CM | POA: Diagnosis not present

## 2021-07-19 DIAGNOSIS — M25562 Pain in left knee: Secondary | ICD-10-CM | POA: Diagnosis not present

## 2021-07-19 DIAGNOSIS — M25561 Pain in right knee: Secondary | ICD-10-CM | POA: Diagnosis not present

## 2021-07-19 DIAGNOSIS — M546 Pain in thoracic spine: Secondary | ICD-10-CM | POA: Diagnosis not present

## 2021-07-19 DIAGNOSIS — M531 Cervicobrachial syndrome: Secondary | ICD-10-CM | POA: Diagnosis not present

## 2021-07-26 DIAGNOSIS — M9902 Segmental and somatic dysfunction of thoracic region: Secondary | ICD-10-CM | POA: Diagnosis not present

## 2021-07-26 DIAGNOSIS — M9906 Segmental and somatic dysfunction of lower extremity: Secondary | ICD-10-CM | POA: Diagnosis not present

## 2021-07-26 DIAGNOSIS — M9903 Segmental and somatic dysfunction of lumbar region: Secondary | ICD-10-CM | POA: Diagnosis not present

## 2021-07-26 DIAGNOSIS — M25562 Pain in left knee: Secondary | ICD-10-CM | POA: Diagnosis not present

## 2021-07-26 DIAGNOSIS — M9905 Segmental and somatic dysfunction of pelvic region: Secondary | ICD-10-CM | POA: Diagnosis not present

## 2021-07-26 DIAGNOSIS — M9901 Segmental and somatic dysfunction of cervical region: Secondary | ICD-10-CM | POA: Diagnosis not present

## 2021-07-26 DIAGNOSIS — M531 Cervicobrachial syndrome: Secondary | ICD-10-CM | POA: Diagnosis not present

## 2021-07-26 DIAGNOSIS — M955 Acquired deformity of pelvis: Secondary | ICD-10-CM | POA: Diagnosis not present

## 2021-07-26 DIAGNOSIS — M546 Pain in thoracic spine: Secondary | ICD-10-CM | POA: Diagnosis not present

## 2021-07-26 DIAGNOSIS — M5137 Other intervertebral disc degeneration, lumbosacral region: Secondary | ICD-10-CM | POA: Diagnosis not present

## 2021-07-26 DIAGNOSIS — M25561 Pain in right knee: Secondary | ICD-10-CM | POA: Diagnosis not present

## 2021-08-09 DIAGNOSIS — M25562 Pain in left knee: Secondary | ICD-10-CM | POA: Diagnosis not present

## 2021-08-09 DIAGNOSIS — M9902 Segmental and somatic dysfunction of thoracic region: Secondary | ICD-10-CM | POA: Diagnosis not present

## 2021-08-09 DIAGNOSIS — M25561 Pain in right knee: Secondary | ICD-10-CM | POA: Diagnosis not present

## 2021-08-09 DIAGNOSIS — M9906 Segmental and somatic dysfunction of lower extremity: Secondary | ICD-10-CM | POA: Diagnosis not present

## 2021-08-09 DIAGNOSIS — M531 Cervicobrachial syndrome: Secondary | ICD-10-CM | POA: Diagnosis not present

## 2021-08-09 DIAGNOSIS — M9901 Segmental and somatic dysfunction of cervical region: Secondary | ICD-10-CM | POA: Diagnosis not present

## 2021-08-09 DIAGNOSIS — M5137 Other intervertebral disc degeneration, lumbosacral region: Secondary | ICD-10-CM | POA: Diagnosis not present

## 2021-08-09 DIAGNOSIS — M546 Pain in thoracic spine: Secondary | ICD-10-CM | POA: Diagnosis not present

## 2021-08-09 DIAGNOSIS — M9903 Segmental and somatic dysfunction of lumbar region: Secondary | ICD-10-CM | POA: Diagnosis not present

## 2021-08-09 DIAGNOSIS — M955 Acquired deformity of pelvis: Secondary | ICD-10-CM | POA: Diagnosis not present

## 2021-08-09 DIAGNOSIS — M9905 Segmental and somatic dysfunction of pelvic region: Secondary | ICD-10-CM | POA: Diagnosis not present

## 2021-08-23 DIAGNOSIS — M9901 Segmental and somatic dysfunction of cervical region: Secondary | ICD-10-CM | POA: Diagnosis not present

## 2021-08-23 DIAGNOSIS — M546 Pain in thoracic spine: Secondary | ICD-10-CM | POA: Diagnosis not present

## 2021-08-23 DIAGNOSIS — M9902 Segmental and somatic dysfunction of thoracic region: Secondary | ICD-10-CM | POA: Diagnosis not present

## 2021-08-23 DIAGNOSIS — M5137 Other intervertebral disc degeneration, lumbosacral region: Secondary | ICD-10-CM | POA: Diagnosis not present

## 2021-08-23 DIAGNOSIS — M955 Acquired deformity of pelvis: Secondary | ICD-10-CM | POA: Diagnosis not present

## 2021-08-23 DIAGNOSIS — M25562 Pain in left knee: Secondary | ICD-10-CM | POA: Diagnosis not present

## 2021-08-23 DIAGNOSIS — M531 Cervicobrachial syndrome: Secondary | ICD-10-CM | POA: Diagnosis not present

## 2021-08-23 DIAGNOSIS — M25561 Pain in right knee: Secondary | ICD-10-CM | POA: Diagnosis not present

## 2021-08-23 DIAGNOSIS — M9903 Segmental and somatic dysfunction of lumbar region: Secondary | ICD-10-CM | POA: Diagnosis not present

## 2021-08-23 DIAGNOSIS — M9906 Segmental and somatic dysfunction of lower extremity: Secondary | ICD-10-CM | POA: Diagnosis not present

## 2021-08-23 DIAGNOSIS — M9905 Segmental and somatic dysfunction of pelvic region: Secondary | ICD-10-CM | POA: Diagnosis not present

## 2021-09-13 DIAGNOSIS — M955 Acquired deformity of pelvis: Secondary | ICD-10-CM | POA: Diagnosis not present

## 2021-09-13 DIAGNOSIS — M9906 Segmental and somatic dysfunction of lower extremity: Secondary | ICD-10-CM | POA: Diagnosis not present

## 2021-09-13 DIAGNOSIS — M9901 Segmental and somatic dysfunction of cervical region: Secondary | ICD-10-CM | POA: Diagnosis not present

## 2021-09-13 DIAGNOSIS — M25561 Pain in right knee: Secondary | ICD-10-CM | POA: Diagnosis not present

## 2021-09-13 DIAGNOSIS — M531 Cervicobrachial syndrome: Secondary | ICD-10-CM | POA: Diagnosis not present

## 2021-09-13 DIAGNOSIS — M9902 Segmental and somatic dysfunction of thoracic region: Secondary | ICD-10-CM | POA: Diagnosis not present

## 2021-09-13 DIAGNOSIS — M546 Pain in thoracic spine: Secondary | ICD-10-CM | POA: Diagnosis not present

## 2021-09-13 DIAGNOSIS — M9903 Segmental and somatic dysfunction of lumbar region: Secondary | ICD-10-CM | POA: Diagnosis not present

## 2021-09-13 DIAGNOSIS — M9905 Segmental and somatic dysfunction of pelvic region: Secondary | ICD-10-CM | POA: Diagnosis not present

## 2021-09-13 DIAGNOSIS — M5137 Other intervertebral disc degeneration, lumbosacral region: Secondary | ICD-10-CM | POA: Diagnosis not present

## 2021-09-13 DIAGNOSIS — M25562 Pain in left knee: Secondary | ICD-10-CM | POA: Diagnosis not present

## 2021-09-27 DIAGNOSIS — M9903 Segmental and somatic dysfunction of lumbar region: Secondary | ICD-10-CM | POA: Diagnosis not present

## 2021-09-27 DIAGNOSIS — M546 Pain in thoracic spine: Secondary | ICD-10-CM | POA: Diagnosis not present

## 2021-09-27 DIAGNOSIS — M9901 Segmental and somatic dysfunction of cervical region: Secondary | ICD-10-CM | POA: Diagnosis not present

## 2021-09-27 DIAGNOSIS — M955 Acquired deformity of pelvis: Secondary | ICD-10-CM | POA: Diagnosis not present

## 2021-09-27 DIAGNOSIS — M25562 Pain in left knee: Secondary | ICD-10-CM | POA: Diagnosis not present

## 2021-09-27 DIAGNOSIS — M9906 Segmental and somatic dysfunction of lower extremity: Secondary | ICD-10-CM | POA: Diagnosis not present

## 2021-09-27 DIAGNOSIS — M9902 Segmental and somatic dysfunction of thoracic region: Secondary | ICD-10-CM | POA: Diagnosis not present

## 2021-09-27 DIAGNOSIS — M5137 Other intervertebral disc degeneration, lumbosacral region: Secondary | ICD-10-CM | POA: Diagnosis not present

## 2021-09-27 DIAGNOSIS — M25561 Pain in right knee: Secondary | ICD-10-CM | POA: Diagnosis not present

## 2021-09-27 DIAGNOSIS — M531 Cervicobrachial syndrome: Secondary | ICD-10-CM | POA: Diagnosis not present

## 2021-09-27 DIAGNOSIS — M9905 Segmental and somatic dysfunction of pelvic region: Secondary | ICD-10-CM | POA: Diagnosis not present

## 2021-10-11 DIAGNOSIS — I10 Essential (primary) hypertension: Secondary | ICD-10-CM | POA: Diagnosis not present

## 2021-10-11 DIAGNOSIS — E785 Hyperlipidemia, unspecified: Secondary | ICD-10-CM | POA: Diagnosis not present

## 2021-10-11 DIAGNOSIS — E6609 Other obesity due to excess calories: Secondary | ICD-10-CM | POA: Diagnosis not present

## 2021-10-18 DIAGNOSIS — M9902 Segmental and somatic dysfunction of thoracic region: Secondary | ICD-10-CM | POA: Diagnosis not present

## 2021-10-18 DIAGNOSIS — M955 Acquired deformity of pelvis: Secondary | ICD-10-CM | POA: Diagnosis not present

## 2021-10-18 DIAGNOSIS — M9901 Segmental and somatic dysfunction of cervical region: Secondary | ICD-10-CM | POA: Diagnosis not present

## 2021-10-18 DIAGNOSIS — M9906 Segmental and somatic dysfunction of lower extremity: Secondary | ICD-10-CM | POA: Diagnosis not present

## 2021-10-18 DIAGNOSIS — M9903 Segmental and somatic dysfunction of lumbar region: Secondary | ICD-10-CM | POA: Diagnosis not present

## 2021-10-18 DIAGNOSIS — M9905 Segmental and somatic dysfunction of pelvic region: Secondary | ICD-10-CM | POA: Diagnosis not present

## 2021-10-18 DIAGNOSIS — M5137 Other intervertebral disc degeneration, lumbosacral region: Secondary | ICD-10-CM | POA: Diagnosis not present

## 2021-10-18 DIAGNOSIS — M25561 Pain in right knee: Secondary | ICD-10-CM | POA: Diagnosis not present

## 2021-10-18 DIAGNOSIS — M546 Pain in thoracic spine: Secondary | ICD-10-CM | POA: Diagnosis not present

## 2021-10-18 DIAGNOSIS — M25562 Pain in left knee: Secondary | ICD-10-CM | POA: Diagnosis not present

## 2021-10-18 DIAGNOSIS — M531 Cervicobrachial syndrome: Secondary | ICD-10-CM | POA: Diagnosis not present

## 2021-11-08 DIAGNOSIS — M25561 Pain in right knee: Secondary | ICD-10-CM | POA: Diagnosis not present

## 2021-11-08 DIAGNOSIS — M9902 Segmental and somatic dysfunction of thoracic region: Secondary | ICD-10-CM | POA: Diagnosis not present

## 2021-11-08 DIAGNOSIS — M9906 Segmental and somatic dysfunction of lower extremity: Secondary | ICD-10-CM | POA: Diagnosis not present

## 2021-11-08 DIAGNOSIS — M531 Cervicobrachial syndrome: Secondary | ICD-10-CM | POA: Diagnosis not present

## 2021-11-08 DIAGNOSIS — M9905 Segmental and somatic dysfunction of pelvic region: Secondary | ICD-10-CM | POA: Diagnosis not present

## 2021-11-08 DIAGNOSIS — M25562 Pain in left knee: Secondary | ICD-10-CM | POA: Diagnosis not present

## 2021-11-08 DIAGNOSIS — M9901 Segmental and somatic dysfunction of cervical region: Secondary | ICD-10-CM | POA: Diagnosis not present

## 2021-11-08 DIAGNOSIS — M5137 Other intervertebral disc degeneration, lumbosacral region: Secondary | ICD-10-CM | POA: Diagnosis not present

## 2021-11-08 DIAGNOSIS — M955 Acquired deformity of pelvis: Secondary | ICD-10-CM | POA: Diagnosis not present

## 2021-11-08 DIAGNOSIS — M9903 Segmental and somatic dysfunction of lumbar region: Secondary | ICD-10-CM | POA: Diagnosis not present

## 2021-11-08 DIAGNOSIS — M546 Pain in thoracic spine: Secondary | ICD-10-CM | POA: Diagnosis not present

## 2021-11-16 DIAGNOSIS — M170 Bilateral primary osteoarthritis of knee: Secondary | ICD-10-CM | POA: Diagnosis not present

## 2021-11-22 DIAGNOSIS — M25562 Pain in left knee: Secondary | ICD-10-CM | POA: Diagnosis not present

## 2021-11-22 DIAGNOSIS — M955 Acquired deformity of pelvis: Secondary | ICD-10-CM | POA: Diagnosis not present

## 2021-11-22 DIAGNOSIS — M531 Cervicobrachial syndrome: Secondary | ICD-10-CM | POA: Diagnosis not present

## 2021-11-22 DIAGNOSIS — M9902 Segmental and somatic dysfunction of thoracic region: Secondary | ICD-10-CM | POA: Diagnosis not present

## 2021-11-22 DIAGNOSIS — M9901 Segmental and somatic dysfunction of cervical region: Secondary | ICD-10-CM | POA: Diagnosis not present

## 2021-11-22 DIAGNOSIS — M5137 Other intervertebral disc degeneration, lumbosacral region: Secondary | ICD-10-CM | POA: Diagnosis not present

## 2021-11-22 DIAGNOSIS — M25561 Pain in right knee: Secondary | ICD-10-CM | POA: Diagnosis not present

## 2021-11-22 DIAGNOSIS — M546 Pain in thoracic spine: Secondary | ICD-10-CM | POA: Diagnosis not present

## 2021-11-22 DIAGNOSIS — M9903 Segmental and somatic dysfunction of lumbar region: Secondary | ICD-10-CM | POA: Diagnosis not present

## 2021-11-22 DIAGNOSIS — M9905 Segmental and somatic dysfunction of pelvic region: Secondary | ICD-10-CM | POA: Diagnosis not present

## 2021-11-22 DIAGNOSIS — M9906 Segmental and somatic dysfunction of lower extremity: Secondary | ICD-10-CM | POA: Diagnosis not present

## 2021-12-06 DIAGNOSIS — M9906 Segmental and somatic dysfunction of lower extremity: Secondary | ICD-10-CM | POA: Diagnosis not present

## 2021-12-06 DIAGNOSIS — M531 Cervicobrachial syndrome: Secondary | ICD-10-CM | POA: Diagnosis not present

## 2021-12-06 DIAGNOSIS — M546 Pain in thoracic spine: Secondary | ICD-10-CM | POA: Diagnosis not present

## 2021-12-06 DIAGNOSIS — M25562 Pain in left knee: Secondary | ICD-10-CM | POA: Diagnosis not present

## 2021-12-06 DIAGNOSIS — M9902 Segmental and somatic dysfunction of thoracic region: Secondary | ICD-10-CM | POA: Diagnosis not present

## 2021-12-06 DIAGNOSIS — M9905 Segmental and somatic dysfunction of pelvic region: Secondary | ICD-10-CM | POA: Diagnosis not present

## 2021-12-06 DIAGNOSIS — M9903 Segmental and somatic dysfunction of lumbar region: Secondary | ICD-10-CM | POA: Diagnosis not present

## 2021-12-06 DIAGNOSIS — M5137 Other intervertebral disc degeneration, lumbosacral region: Secondary | ICD-10-CM | POA: Diagnosis not present

## 2021-12-06 DIAGNOSIS — M25561 Pain in right knee: Secondary | ICD-10-CM | POA: Diagnosis not present

## 2021-12-06 DIAGNOSIS — M955 Acquired deformity of pelvis: Secondary | ICD-10-CM | POA: Diagnosis not present

## 2021-12-06 DIAGNOSIS — M9901 Segmental and somatic dysfunction of cervical region: Secondary | ICD-10-CM | POA: Diagnosis not present

## 2021-12-13 DIAGNOSIS — M9903 Segmental and somatic dysfunction of lumbar region: Secondary | ICD-10-CM | POA: Diagnosis not present

## 2021-12-13 DIAGNOSIS — M9905 Segmental and somatic dysfunction of pelvic region: Secondary | ICD-10-CM | POA: Diagnosis not present

## 2021-12-13 DIAGNOSIS — M25561 Pain in right knee: Secondary | ICD-10-CM | POA: Diagnosis not present

## 2021-12-13 DIAGNOSIS — M9906 Segmental and somatic dysfunction of lower extremity: Secondary | ICD-10-CM | POA: Diagnosis not present

## 2021-12-13 DIAGNOSIS — M955 Acquired deformity of pelvis: Secondary | ICD-10-CM | POA: Diagnosis not present

## 2021-12-13 DIAGNOSIS — M531 Cervicobrachial syndrome: Secondary | ICD-10-CM | POA: Diagnosis not present

## 2021-12-13 DIAGNOSIS — M9902 Segmental and somatic dysfunction of thoracic region: Secondary | ICD-10-CM | POA: Diagnosis not present

## 2021-12-13 DIAGNOSIS — M25562 Pain in left knee: Secondary | ICD-10-CM | POA: Diagnosis not present

## 2021-12-13 DIAGNOSIS — M9901 Segmental and somatic dysfunction of cervical region: Secondary | ICD-10-CM | POA: Diagnosis not present

## 2021-12-13 DIAGNOSIS — M5137 Other intervertebral disc degeneration, lumbosacral region: Secondary | ICD-10-CM | POA: Diagnosis not present

## 2021-12-13 DIAGNOSIS — M546 Pain in thoracic spine: Secondary | ICD-10-CM | POA: Diagnosis not present

## 2021-12-20 DIAGNOSIS — M1711 Unilateral primary osteoarthritis, right knee: Secondary | ICD-10-CM | POA: Diagnosis not present

## 2022-01-03 DIAGNOSIS — E785 Hyperlipidemia, unspecified: Secondary | ICD-10-CM | POA: Diagnosis not present

## 2022-01-03 DIAGNOSIS — R69 Illness, unspecified: Secondary | ICD-10-CM | POA: Diagnosis not present

## 2022-01-03 DIAGNOSIS — M1711 Unilateral primary osteoarthritis, right knee: Secondary | ICD-10-CM | POA: Diagnosis not present

## 2022-01-04 DIAGNOSIS — Z96651 Presence of right artificial knee joint: Secondary | ICD-10-CM | POA: Diagnosis not present

## 2022-01-04 DIAGNOSIS — M1711 Unilateral primary osteoarthritis, right knee: Secondary | ICD-10-CM | POA: Diagnosis not present

## 2022-01-10 DIAGNOSIS — Z96651 Presence of right artificial knee joint: Secondary | ICD-10-CM | POA: Diagnosis not present

## 2022-01-15 DIAGNOSIS — Z96651 Presence of right artificial knee joint: Secondary | ICD-10-CM | POA: Diagnosis not present

## 2022-01-18 DIAGNOSIS — Z720 Tobacco use: Secondary | ICD-10-CM | POA: Diagnosis not present

## 2022-01-18 DIAGNOSIS — K5900 Constipation, unspecified: Secondary | ICD-10-CM | POA: Diagnosis not present

## 2022-01-18 DIAGNOSIS — M1990 Unspecified osteoarthritis, unspecified site: Secondary | ICD-10-CM | POA: Diagnosis not present

## 2022-01-18 DIAGNOSIS — K08409 Partial loss of teeth, unspecified cause, unspecified class: Secondary | ICD-10-CM | POA: Diagnosis not present

## 2022-01-18 DIAGNOSIS — E785 Hyperlipidemia, unspecified: Secondary | ICD-10-CM | POA: Diagnosis not present

## 2022-01-18 DIAGNOSIS — Z8249 Family history of ischemic heart disease and other diseases of the circulatory system: Secondary | ICD-10-CM | POA: Diagnosis not present

## 2022-01-18 DIAGNOSIS — R32 Unspecified urinary incontinence: Secondary | ICD-10-CM | POA: Diagnosis not present

## 2022-01-18 DIAGNOSIS — E669 Obesity, unspecified: Secondary | ICD-10-CM | POA: Diagnosis not present

## 2022-01-18 DIAGNOSIS — R69 Illness, unspecified: Secondary | ICD-10-CM | POA: Diagnosis not present

## 2022-01-18 DIAGNOSIS — Z791 Long term (current) use of non-steroidal anti-inflammatories (NSAID): Secondary | ICD-10-CM | POA: Diagnosis not present

## 2022-01-18 DIAGNOSIS — I10 Essential (primary) hypertension: Secondary | ICD-10-CM | POA: Diagnosis not present

## 2022-01-18 DIAGNOSIS — Z833 Family history of diabetes mellitus: Secondary | ICD-10-CM | POA: Diagnosis not present

## 2022-01-22 DIAGNOSIS — Z96651 Presence of right artificial knee joint: Secondary | ICD-10-CM | POA: Diagnosis not present

## 2022-01-24 DIAGNOSIS — Z96651 Presence of right artificial knee joint: Secondary | ICD-10-CM | POA: Diagnosis not present

## 2022-01-30 DIAGNOSIS — Z96651 Presence of right artificial knee joint: Secondary | ICD-10-CM | POA: Diagnosis not present

## 2022-02-04 DIAGNOSIS — Z96651 Presence of right artificial knee joint: Secondary | ICD-10-CM | POA: Diagnosis not present

## 2022-02-11 DIAGNOSIS — Z96651 Presence of right artificial knee joint: Secondary | ICD-10-CM | POA: Diagnosis not present

## 2022-02-15 DIAGNOSIS — Z96651 Presence of right artificial knee joint: Secondary | ICD-10-CM | POA: Diagnosis not present

## 2022-02-28 DIAGNOSIS — Z96651 Presence of right artificial knee joint: Secondary | ICD-10-CM | POA: Diagnosis not present

## 2022-03-07 DIAGNOSIS — Z96651 Presence of right artificial knee joint: Secondary | ICD-10-CM | POA: Diagnosis not present

## 2022-03-14 DIAGNOSIS — Z96651 Presence of right artificial knee joint: Secondary | ICD-10-CM | POA: Diagnosis not present

## 2022-03-21 DIAGNOSIS — Z96651 Presence of right artificial knee joint: Secondary | ICD-10-CM | POA: Diagnosis not present

## 2022-03-26 DIAGNOSIS — Z96651 Presence of right artificial knee joint: Secondary | ICD-10-CM | POA: Diagnosis not present

## 2022-04-01 DIAGNOSIS — I10 Essential (primary) hypertension: Secondary | ICD-10-CM | POA: Diagnosis not present

## 2022-04-01 DIAGNOSIS — Z791 Long term (current) use of non-steroidal anti-inflammatories (NSAID): Secondary | ICD-10-CM | POA: Diagnosis not present

## 2022-04-01 DIAGNOSIS — E785 Hyperlipidemia, unspecified: Secondary | ICD-10-CM | POA: Diagnosis not present

## 2022-04-01 DIAGNOSIS — Z87891 Personal history of nicotine dependence: Secondary | ICD-10-CM | POA: Diagnosis not present

## 2022-04-01 DIAGNOSIS — K219 Gastro-esophageal reflux disease without esophagitis: Secondary | ICD-10-CM | POA: Diagnosis not present

## 2022-04-01 DIAGNOSIS — Z823 Family history of stroke: Secondary | ICD-10-CM | POA: Diagnosis not present

## 2022-04-01 DIAGNOSIS — E669 Obesity, unspecified: Secondary | ICD-10-CM | POA: Diagnosis not present

## 2022-04-01 DIAGNOSIS — M1990 Unspecified osteoarthritis, unspecified site: Secondary | ICD-10-CM | POA: Diagnosis not present

## 2022-04-01 DIAGNOSIS — R69 Illness, unspecified: Secondary | ICD-10-CM | POA: Diagnosis not present

## 2022-04-01 DIAGNOSIS — G629 Polyneuropathy, unspecified: Secondary | ICD-10-CM | POA: Diagnosis not present

## 2022-04-01 DIAGNOSIS — Z8249 Family history of ischemic heart disease and other diseases of the circulatory system: Secondary | ICD-10-CM | POA: Diagnosis not present

## 2022-04-01 DIAGNOSIS — Z833 Family history of diabetes mellitus: Secondary | ICD-10-CM | POA: Diagnosis not present

## 2022-04-30 DIAGNOSIS — Z96651 Presence of right artificial knee joint: Secondary | ICD-10-CM | POA: Diagnosis not present

## 2022-07-25 DIAGNOSIS — M955 Acquired deformity of pelvis: Secondary | ICD-10-CM | POA: Diagnosis not present

## 2022-07-25 DIAGNOSIS — M531 Cervicobrachial syndrome: Secondary | ICD-10-CM | POA: Diagnosis not present

## 2022-07-25 DIAGNOSIS — M546 Pain in thoracic spine: Secondary | ICD-10-CM | POA: Diagnosis not present

## 2022-07-25 DIAGNOSIS — M9901 Segmental and somatic dysfunction of cervical region: Secondary | ICD-10-CM | POA: Diagnosis not present

## 2022-07-25 DIAGNOSIS — M9905 Segmental and somatic dysfunction of pelvic region: Secondary | ICD-10-CM | POA: Diagnosis not present

## 2022-07-25 DIAGNOSIS — M545 Low back pain, unspecified: Secondary | ICD-10-CM | POA: Diagnosis not present

## 2022-07-25 DIAGNOSIS — M9903 Segmental and somatic dysfunction of lumbar region: Secondary | ICD-10-CM | POA: Diagnosis not present

## 2022-07-25 DIAGNOSIS — M9902 Segmental and somatic dysfunction of thoracic region: Secondary | ICD-10-CM | POA: Diagnosis not present

## 2022-08-08 DIAGNOSIS — M545 Low back pain, unspecified: Secondary | ICD-10-CM | POA: Diagnosis not present

## 2022-08-08 DIAGNOSIS — M955 Acquired deformity of pelvis: Secondary | ICD-10-CM | POA: Diagnosis not present

## 2022-08-08 DIAGNOSIS — M9905 Segmental and somatic dysfunction of pelvic region: Secondary | ICD-10-CM | POA: Diagnosis not present

## 2022-08-08 DIAGNOSIS — M546 Pain in thoracic spine: Secondary | ICD-10-CM | POA: Diagnosis not present

## 2022-08-08 DIAGNOSIS — M531 Cervicobrachial syndrome: Secondary | ICD-10-CM | POA: Diagnosis not present

## 2022-08-08 DIAGNOSIS — M9902 Segmental and somatic dysfunction of thoracic region: Secondary | ICD-10-CM | POA: Diagnosis not present

## 2022-08-08 DIAGNOSIS — M9901 Segmental and somatic dysfunction of cervical region: Secondary | ICD-10-CM | POA: Diagnosis not present

## 2022-08-08 DIAGNOSIS — M9903 Segmental and somatic dysfunction of lumbar region: Secondary | ICD-10-CM | POA: Diagnosis not present

## 2022-09-05 DIAGNOSIS — M531 Cervicobrachial syndrome: Secondary | ICD-10-CM | POA: Diagnosis not present

## 2022-09-05 DIAGNOSIS — M545 Low back pain, unspecified: Secondary | ICD-10-CM | POA: Diagnosis not present

## 2022-09-05 DIAGNOSIS — M546 Pain in thoracic spine: Secondary | ICD-10-CM | POA: Diagnosis not present

## 2022-09-05 DIAGNOSIS — M955 Acquired deformity of pelvis: Secondary | ICD-10-CM | POA: Diagnosis not present

## 2022-09-05 DIAGNOSIS — M9905 Segmental and somatic dysfunction of pelvic region: Secondary | ICD-10-CM | POA: Diagnosis not present

## 2022-09-05 DIAGNOSIS — M9901 Segmental and somatic dysfunction of cervical region: Secondary | ICD-10-CM | POA: Diagnosis not present

## 2022-09-05 DIAGNOSIS — M9903 Segmental and somatic dysfunction of lumbar region: Secondary | ICD-10-CM | POA: Diagnosis not present

## 2022-09-05 DIAGNOSIS — M9902 Segmental and somatic dysfunction of thoracic region: Secondary | ICD-10-CM | POA: Diagnosis not present

## 2022-09-19 DIAGNOSIS — M955 Acquired deformity of pelvis: Secondary | ICD-10-CM | POA: Diagnosis not present

## 2022-09-19 DIAGNOSIS — M546 Pain in thoracic spine: Secondary | ICD-10-CM | POA: Diagnosis not present

## 2022-09-19 DIAGNOSIS — M545 Low back pain, unspecified: Secondary | ICD-10-CM | POA: Diagnosis not present

## 2022-09-19 DIAGNOSIS — M531 Cervicobrachial syndrome: Secondary | ICD-10-CM | POA: Diagnosis not present

## 2022-09-19 DIAGNOSIS — M9901 Segmental and somatic dysfunction of cervical region: Secondary | ICD-10-CM | POA: Diagnosis not present

## 2022-09-19 DIAGNOSIS — M9902 Segmental and somatic dysfunction of thoracic region: Secondary | ICD-10-CM | POA: Diagnosis not present

## 2022-09-19 DIAGNOSIS — M9903 Segmental and somatic dysfunction of lumbar region: Secondary | ICD-10-CM | POA: Diagnosis not present

## 2022-09-19 DIAGNOSIS — M9905 Segmental and somatic dysfunction of pelvic region: Secondary | ICD-10-CM | POA: Diagnosis not present

## 2022-09-26 DIAGNOSIS — E785 Hyperlipidemia, unspecified: Secondary | ICD-10-CM | POA: Diagnosis not present

## 2022-09-26 DIAGNOSIS — I10 Essential (primary) hypertension: Secondary | ICD-10-CM | POA: Diagnosis not present

## 2022-10-03 DIAGNOSIS — M546 Pain in thoracic spine: Secondary | ICD-10-CM | POA: Diagnosis not present

## 2022-10-03 DIAGNOSIS — M9905 Segmental and somatic dysfunction of pelvic region: Secondary | ICD-10-CM | POA: Diagnosis not present

## 2022-10-03 DIAGNOSIS — M955 Acquired deformity of pelvis: Secondary | ICD-10-CM | POA: Diagnosis not present

## 2022-10-03 DIAGNOSIS — M9903 Segmental and somatic dysfunction of lumbar region: Secondary | ICD-10-CM | POA: Diagnosis not present

## 2022-10-03 DIAGNOSIS — M9902 Segmental and somatic dysfunction of thoracic region: Secondary | ICD-10-CM | POA: Diagnosis not present

## 2022-10-03 DIAGNOSIS — M545 Low back pain, unspecified: Secondary | ICD-10-CM | POA: Diagnosis not present

## 2022-10-03 DIAGNOSIS — M9901 Segmental and somatic dysfunction of cervical region: Secondary | ICD-10-CM | POA: Diagnosis not present

## 2022-10-03 DIAGNOSIS — M531 Cervicobrachial syndrome: Secondary | ICD-10-CM | POA: Diagnosis not present

## 2022-10-17 DIAGNOSIS — M9905 Segmental and somatic dysfunction of pelvic region: Secondary | ICD-10-CM | POA: Diagnosis not present

## 2022-10-17 DIAGNOSIS — M531 Cervicobrachial syndrome: Secondary | ICD-10-CM | POA: Diagnosis not present

## 2022-10-17 DIAGNOSIS — M9902 Segmental and somatic dysfunction of thoracic region: Secondary | ICD-10-CM | POA: Diagnosis not present

## 2022-10-17 DIAGNOSIS — M545 Low back pain, unspecified: Secondary | ICD-10-CM | POA: Diagnosis not present

## 2022-10-17 DIAGNOSIS — M9903 Segmental and somatic dysfunction of lumbar region: Secondary | ICD-10-CM | POA: Diagnosis not present

## 2022-10-17 DIAGNOSIS — M955 Acquired deformity of pelvis: Secondary | ICD-10-CM | POA: Diagnosis not present

## 2022-10-17 DIAGNOSIS — M9901 Segmental and somatic dysfunction of cervical region: Secondary | ICD-10-CM | POA: Diagnosis not present

## 2022-10-17 DIAGNOSIS — M546 Pain in thoracic spine: Secondary | ICD-10-CM | POA: Diagnosis not present

## 2022-10-30 DIAGNOSIS — M9901 Segmental and somatic dysfunction of cervical region: Secondary | ICD-10-CM | POA: Diagnosis not present

## 2022-10-30 DIAGNOSIS — M955 Acquired deformity of pelvis: Secondary | ICD-10-CM | POA: Diagnosis not present

## 2022-10-30 DIAGNOSIS — M9903 Segmental and somatic dysfunction of lumbar region: Secondary | ICD-10-CM | POA: Diagnosis not present

## 2022-10-30 DIAGNOSIS — M9902 Segmental and somatic dysfunction of thoracic region: Secondary | ICD-10-CM | POA: Diagnosis not present

## 2022-10-30 DIAGNOSIS — M546 Pain in thoracic spine: Secondary | ICD-10-CM | POA: Diagnosis not present

## 2022-10-30 DIAGNOSIS — M9905 Segmental and somatic dysfunction of pelvic region: Secondary | ICD-10-CM | POA: Diagnosis not present

## 2022-10-30 DIAGNOSIS — M545 Low back pain, unspecified: Secondary | ICD-10-CM | POA: Diagnosis not present

## 2022-10-30 DIAGNOSIS — M531 Cervicobrachial syndrome: Secondary | ICD-10-CM | POA: Diagnosis not present

## 2022-11-14 DIAGNOSIS — M955 Acquired deformity of pelvis: Secondary | ICD-10-CM | POA: Diagnosis not present

## 2022-11-14 DIAGNOSIS — M9905 Segmental and somatic dysfunction of pelvic region: Secondary | ICD-10-CM | POA: Diagnosis not present

## 2022-11-14 DIAGNOSIS — M531 Cervicobrachial syndrome: Secondary | ICD-10-CM | POA: Diagnosis not present

## 2022-11-14 DIAGNOSIS — M9903 Segmental and somatic dysfunction of lumbar region: Secondary | ICD-10-CM | POA: Diagnosis not present

## 2022-11-14 DIAGNOSIS — M9902 Segmental and somatic dysfunction of thoracic region: Secondary | ICD-10-CM | POA: Diagnosis not present

## 2022-11-14 DIAGNOSIS — M545 Low back pain, unspecified: Secondary | ICD-10-CM | POA: Diagnosis not present

## 2022-11-14 DIAGNOSIS — M9901 Segmental and somatic dysfunction of cervical region: Secondary | ICD-10-CM | POA: Diagnosis not present

## 2022-11-14 DIAGNOSIS — M546 Pain in thoracic spine: Secondary | ICD-10-CM | POA: Diagnosis not present

## 2022-11-28 DIAGNOSIS — M545 Low back pain, unspecified: Secondary | ICD-10-CM | POA: Diagnosis not present

## 2022-11-28 DIAGNOSIS — M531 Cervicobrachial syndrome: Secondary | ICD-10-CM | POA: Diagnosis not present

## 2022-11-28 DIAGNOSIS — M546 Pain in thoracic spine: Secondary | ICD-10-CM | POA: Diagnosis not present

## 2022-11-28 DIAGNOSIS — M9902 Segmental and somatic dysfunction of thoracic region: Secondary | ICD-10-CM | POA: Diagnosis not present

## 2022-11-28 DIAGNOSIS — M955 Acquired deformity of pelvis: Secondary | ICD-10-CM | POA: Diagnosis not present

## 2022-11-28 DIAGNOSIS — M9903 Segmental and somatic dysfunction of lumbar region: Secondary | ICD-10-CM | POA: Diagnosis not present

## 2022-11-28 DIAGNOSIS — M9905 Segmental and somatic dysfunction of pelvic region: Secondary | ICD-10-CM | POA: Diagnosis not present

## 2022-11-28 DIAGNOSIS — M9901 Segmental and somatic dysfunction of cervical region: Secondary | ICD-10-CM | POA: Diagnosis not present

## 2022-12-12 DIAGNOSIS — M955 Acquired deformity of pelvis: Secondary | ICD-10-CM | POA: Diagnosis not present

## 2022-12-12 DIAGNOSIS — M546 Pain in thoracic spine: Secondary | ICD-10-CM | POA: Diagnosis not present

## 2022-12-12 DIAGNOSIS — M9903 Segmental and somatic dysfunction of lumbar region: Secondary | ICD-10-CM | POA: Diagnosis not present

## 2022-12-12 DIAGNOSIS — M531 Cervicobrachial syndrome: Secondary | ICD-10-CM | POA: Diagnosis not present

## 2022-12-12 DIAGNOSIS — M9902 Segmental and somatic dysfunction of thoracic region: Secondary | ICD-10-CM | POA: Diagnosis not present

## 2022-12-12 DIAGNOSIS — M9905 Segmental and somatic dysfunction of pelvic region: Secondary | ICD-10-CM | POA: Diagnosis not present

## 2022-12-12 DIAGNOSIS — M9901 Segmental and somatic dysfunction of cervical region: Secondary | ICD-10-CM | POA: Diagnosis not present

## 2022-12-12 DIAGNOSIS — M545 Low back pain, unspecified: Secondary | ICD-10-CM | POA: Diagnosis not present

## 2022-12-26 DIAGNOSIS — M9903 Segmental and somatic dysfunction of lumbar region: Secondary | ICD-10-CM | POA: Diagnosis not present

## 2022-12-26 DIAGNOSIS — M531 Cervicobrachial syndrome: Secondary | ICD-10-CM | POA: Diagnosis not present

## 2022-12-26 DIAGNOSIS — M955 Acquired deformity of pelvis: Secondary | ICD-10-CM | POA: Diagnosis not present

## 2022-12-26 DIAGNOSIS — M545 Low back pain, unspecified: Secondary | ICD-10-CM | POA: Diagnosis not present

## 2022-12-26 DIAGNOSIS — M9901 Segmental and somatic dysfunction of cervical region: Secondary | ICD-10-CM | POA: Diagnosis not present

## 2022-12-26 DIAGNOSIS — M9905 Segmental and somatic dysfunction of pelvic region: Secondary | ICD-10-CM | POA: Diagnosis not present

## 2022-12-26 DIAGNOSIS — M546 Pain in thoracic spine: Secondary | ICD-10-CM | POA: Diagnosis not present

## 2022-12-26 DIAGNOSIS — M9902 Segmental and somatic dysfunction of thoracic region: Secondary | ICD-10-CM | POA: Diagnosis not present

## 2023-07-17 ENCOUNTER — Other Ambulatory Visit: Payer: Self-pay | Admitting: Adult Health

## 2023-07-17 DIAGNOSIS — Z1231 Encounter for screening mammogram for malignant neoplasm of breast: Secondary | ICD-10-CM

## 2023-07-30 ENCOUNTER — Ambulatory Visit
Admission: RE | Admit: 2023-07-30 | Discharge: 2023-07-30 | Disposition: A | Discharge status: Home / Self Care | Source: Ambulatory Visit | Attending: Adult Health | Admitting: Adult Health

## 2023-07-30 DIAGNOSIS — Z1231 Encounter for screening mammogram for malignant neoplasm of breast: Secondary | ICD-10-CM

## 2023-11-06 ENCOUNTER — Other Ambulatory Visit: Payer: Self-pay | Admitting: Family Medicine

## 2023-11-07 NOTE — Telephone Encounter (Signed)
Patient not established at this office.

## 2023-11-11 ENCOUNTER — Ambulatory Visit (INDEPENDENT_AMBULATORY_CARE_PROVIDER_SITE_OTHER): Admitting: Family Medicine

## 2023-11-11 ENCOUNTER — Encounter: Payer: Self-pay | Admitting: Family Medicine

## 2023-11-11 VITALS — BP 169/89 | HR 67 | Temp 98.7°F | Resp 16 | Ht 61.5 in | Wt 185.5 lb

## 2023-11-11 DIAGNOSIS — Z1159 Encounter for screening for other viral diseases: Secondary | ICD-10-CM

## 2023-11-11 DIAGNOSIS — M5442 Lumbago with sciatica, left side: Secondary | ICD-10-CM

## 2023-11-11 DIAGNOSIS — F411 Generalized anxiety disorder: Secondary | ICD-10-CM

## 2023-11-11 DIAGNOSIS — K219 Gastro-esophageal reflux disease without esophagitis: Secondary | ICD-10-CM | POA: Insufficient documentation

## 2023-11-11 DIAGNOSIS — F419 Anxiety disorder, unspecified: Secondary | ICD-10-CM | POA: Insufficient documentation

## 2023-11-11 DIAGNOSIS — Z79899 Other long term (current) drug therapy: Secondary | ICD-10-CM | POA: Diagnosis not present

## 2023-11-11 DIAGNOSIS — G2581 Restless legs syndrome: Secondary | ICD-10-CM | POA: Diagnosis not present

## 2023-11-11 DIAGNOSIS — G8929 Other chronic pain: Secondary | ICD-10-CM

## 2023-11-11 DIAGNOSIS — R030 Elevated blood-pressure reading, without diagnosis of hypertension: Secondary | ICD-10-CM

## 2023-11-11 DIAGNOSIS — M170 Bilateral primary osteoarthritis of knee: Secondary | ICD-10-CM

## 2023-11-11 DIAGNOSIS — E785 Hyperlipidemia, unspecified: Secondary | ICD-10-CM

## 2023-11-11 DIAGNOSIS — Z130 Encounter for screening for diseases of the blood and blood-forming organs and certain disorders involving the immune mechanism: Secondary | ICD-10-CM

## 2023-11-11 DIAGNOSIS — Z114 Encounter for screening for human immunodeficiency virus [HIV]: Secondary | ICD-10-CM

## 2023-11-11 DIAGNOSIS — F410 Panic disorder [episodic paroxysmal anxiety] without agoraphobia: Secondary | ICD-10-CM

## 2023-11-11 DIAGNOSIS — M5441 Lumbago with sciatica, right side: Secondary | ICD-10-CM

## 2023-11-11 DIAGNOSIS — M62830 Muscle spasm of back: Secondary | ICD-10-CM

## 2023-11-11 DIAGNOSIS — E66811 Obesity, class 1: Secondary | ICD-10-CM

## 2023-11-11 DIAGNOSIS — Z7689 Persons encountering health services in other specified circumstances: Secondary | ICD-10-CM

## 2023-11-11 DIAGNOSIS — Z96651 Presence of right artificial knee joint: Secondary | ICD-10-CM

## 2023-11-11 MED ORDER — CYCLOBENZAPRINE HCL 10 MG PO TABS: 10.0000 mg | ORAL_TABLET | Freq: Three times a day (TID) | ORAL | 1 refills | Status: DC | PRN

## 2023-11-11 MED ORDER — ESCITALOPRAM OXALATE 10 MG PO TABS: 10.0000 mg | ORAL_TABLET | Freq: Every day | ORAL | 1 refills | Status: DC

## 2023-11-11 MED ORDER — TRAMADOL HCL 50 MG PO TABS
50.0000 mg | ORAL_TABLET | Freq: Two times a day (BID) | ORAL | 2 refills | Status: AC | PRN
Start: 2023-11-11 — End: ?

## 2023-11-11 MED ORDER — ATORVASTATIN CALCIUM 20 MG PO TABS
20.0000 mg | ORAL_TABLET | Freq: Every day | ORAL | 1 refills | Status: AC
Start: 2023-11-11 — End: ?

## 2023-11-11 MED ORDER — ALPRAZOLAM 0.5 MG PO TABS: 0.5000 mg | ORAL_TABLET | Freq: Two times a day (BID) | ORAL | 0 refills | Status: AC | PRN

## 2023-11-11 MED ORDER — MELOXICAM 15 MG PO TABS: 15.0000 mg | ORAL_TABLET | Freq: Every day | ORAL | 1 refills | Status: AC

## 2023-11-11 MED ORDER — FENOFIBRATE 145 MG PO TABS: 145.0000 mg | ORAL_TABLET | Freq: Every day | ORAL | 1 refills | Status: AC

## 2023-11-11 NOTE — Progress Notes (Unsigned)
 New patient visit   Patient: Victoria Bautista   DOB: 1960-06-08   63 y.o. Female  MRN: [id number] Visit Date: 11/11/2023  Today's healthcare provider: LAURAINE LOISE BUOY, DO   Chief Complaint  Patient presents with   Establish Care    New patient here to establish care.  Mammogram: Done 08/05/2023- Normal Flu Vaccine: Patient Declined   Subjective    Victoria Bautista is a 63 y.o. female who presents today as a new patient to establish care.  HPI HPI     Establish Care    Additional comments: New patient here to establish care.  Mammogram: Done 08/05/2023- Normal Flu Vaccine: Patient Declined      Last edited by Rosas, Joseline E, CMA on 11/11/2023  2:11 PM.      Victoria Bautista is a 63 year old female who presents to establish care.  She is currently taking gabapentin for leg twitching at night, with a regimen of 100 mg in the morning and afternoon, and 300 mg at night. She is attempting to wean off due to concerns about potential side effects, including dementia and Alzheimer's, which her father recently passed away from. She experiences leg twitching when she forgets her nighttime dose.  She has arthritis in her knees, back, and fingers, and takes meloxicam  for inflammation. Her right knee has been replaced. She takes tramadol  for pain in her knees and back, usually one in the morning and one in the afternoon. She reports occasional sciatica, primarily affecting her left leg.  For anxiety, she takes Xanax , usually one in the morning and sometimes a second dose if needed. She has tried Zoloft in the past but experienced adverse effects. 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 She also takes melatonin and black cohosh at night to help with sleep and hot flashes.  She occasionally consumes alcohol, particularly after stressful days. She has a history of high cholesterol, which she attributes to her diet, specifically biscuit consumption. She has  previously managed her cholesterol with dietary changes and protein shakes.  She has not had recent blood work and is unsure about her last Pap smear. She has not had a tetanus vaccine in the last ten years and is not currently taking any medication for reflux. She reports occasional heartburn but no significant issues with anemia or thyroid  problems. She has not been screened for HIV or hepatitis C.    Past Medical History:  Diagnosis Date   Anxiety    Arthritis    Stress incontinence    Past Surgical History:  Procedure Laterality Date   COLONOSCOPY WITH PROPOFOL  N/A 02/28/2016   Procedure: COLONOSCOPY WITH PROPOFOL ;  Surgeon: Lamar ONEIDA Holmes, MD;  Location[facility] END[facility]COPY;  Service: Endoscopy;  Laterality: N/A;   JOINT REPLACEMENT     KNEE ARTHROSCOPY Right 06/20/2016   Procedure: right knee arthroscopy, partial and lateral menisectomy, medial femoral chondroplasty, loose body removal;  Surgeon: Ozell Flake, MD;  Location: [facility] ORS;  Service: Orthopedics;  Laterality: Right;   TUBAL LIGATION     Family Status  Relation Name Status   Mother  Alive   Father  Deceased   Neg Hx  (Not Specified)  No partnership data on file   Family History  Problem Relation Age of Onset   Diabetes Father    Stroke Father    Dementia Father    Cancer Neg Hx    Heart disease Neg Hx    Social History  Socioeconomic History   Marital status: Single    Spouse name: Not on file   Number of children: Not on file   Years of education: Not on file   Highest education level: Not on file  Occupational History   Not on file  Tobacco Use   Smoking status: Every Day    Current packs/day: 0.50    Average packs/day: 0.5 packs/day for 35.0 years (17.5 ttl pk-yrs)    Types: Cigarettes   Smokeless tobacco: Never  Vaping Use   Vaping status: Never Used  Substance and Sexual Activity   Alcohol use: Yes    Comment: occ   Drug use: No   Sexual activity: Not Currently  Other Topics Concern   Not  on file  Social History Narrative   Not on file   Social Drivers of Health   Financial Resource Strain: Not on file  Food Insecurity: Not on file  Transportation Needs: Not on file  Physical Activity: Not on file  Stress: Not on file  Social Connections: Not on file   Outpatient Medications Prior to Visit  Medication Sig Note   acetaminophen  (TYLENOL  ARTHRITIS PAIN) 650 MG CR tablet Take 1,950 mg by mouth every 8 (eight) hours as needed for pain.    Black Cohosh 40 MG CAPS Take 80 mg by mouth at bedtime.    cholecalciferol (VITAMIN D ) 1000 units tablet Take 2,000 Units by mouth daily.    Coenzyme Q10 (COQ10) 100 MG CAPS Take 1 capsule by mouth daily.    gabapentin (NEURONTIN) 100 MG capsule Take 100 mg by mouth 2 (two) times daily. In the morning and afternoon 11/11/2023: Patient currently trying to wean herself off due to concern for dementia.   gabapentin (NEURONTIN) 300 MG capsule Take 600 mg by mouth at bedtime. 11/11/2023: Patient currently trying to wean herself off due to concern for dementia.   Glucosamine-Chondroitin (OSTEO BI-FLEX REGULAR STRENGTH PO) Take 2 tablets by mouth daily.     Melatonin 10 MG TABS Take 10 mg by mouth at bedtime.    Multiple Vitamin (MULTIVITAMIN) tablet Take 2 tablets by mouth daily.     OMEGA 3-6-9 FATTY ACIDS PO Take 1 capsule by mouth daily.    [DISCONTINUED] ALPRAZolam  (XANAX ) 1 MG tablet Take 1 mg by mouth 2 (two) times daily.  11/11/2023: dosage remaining the same for the time being; increasingly ability to reduce dose with lower dose pills.   [DISCONTINUED] atorvastatin  (LIPITOR) 20 MG tablet Take 20 mg by mouth daily.    [DISCONTINUED] cyclobenzaprine  (FLEXERIL ) 10 MG tablet Take 10 mg by mouth 3 (three) times daily as needed for muscle spasms.    [DISCONTINUED] fenofibrate  (TRICOR ) 145 MG tablet Take 145 mg by mouth daily.    [DISCONTINUED] gabapentin (NEURONTIN) 300 MG capsule Take 300 mg by mouth at bedtime.    [DISCONTINUED] meloxicam  (MOBIC )  15 MG tablet Take 1 tablet (15 mg total) by mouth daily.    [DISCONTINUED] traMADol  (ULTRAM ) 50 MG tablet Take 50 mg by mouth 2 (two) times daily as needed.    calcium  carbonate (OS-CAL - DOSED IN MG OF ELEMENTAL CALCIUM ) 1250 (500 Ca) MG tablet Take 1,250 mg by mouth daily.    [DISCONTINUED] Cranberry 250 MG CAPS Take 1 capsule by mouth daily.    [DISCONTINUED] Cyanocobalamin (B-12) 5000 MCG SUBL Place 1 tablet under the tongue daily.    [DISCONTINUED] HYDROcodone -acetaminophen  (NORCO) 5-325 MG tablet Take 1-2 tablets by mouth every 6 (six) hours as needed.    [  DISCONTINUED] HYDROcodone -acetaminophen  (NORCO/VICODIN) 5-325 MG tablet Take 1 tablet by mouth every 4 (four) hours as needed for moderate pain.    [DISCONTINUED] vitamin E 400 UNIT capsule Take 400 Units by mouth daily.    No facility-administered medications prior to visit.   No Known Allergies   There is no immunization history on file for this patient.  Health Maintenance  Topic Date Due   Cervical Cancer Screening (HPV/Pap Cotest)  Never done   DTaP/Tdap/Td (1 - Tdap) 12/08/2023 (Originally 03/26/1981)   Zoster Vaccines- Shingrix (1 of 2) 02/10/2024 (Originally 03/26/2012)   Influenza Vaccine  05/25/2024 (Originally 09/26/2023)   COVID-19 Vaccine (3 - 2025-26 season) 11/09/2024 (Originally 10/27/2023)   Pneumococcal Vaccine: 50+ Years (1 of 2 - PCV) 11/10/2024 (Originally 03/26/1981)   Mammogram  07/29/2025   Colonoscopy  02/27/2026   Hepatitis C Screening  Completed   HIV Screening  Completed   Hepatitis B Vaccines 19-59 Average Risk  Aged Out   HPV VACCINES  Aged Out   Meningococcal B Vaccine  Aged Out    Patient Care Team: Alayzha An, Lauraine SAILOR, DO as PCP - General (Family Medicine)       Objective    BP (!) 169/89 (BP Location: Left Arm, Patient Position: Sitting, Cuff Size: Normal)   Pulse 67   Temp 98.7 F (37.1 C) (Oral)   Resp 16   Ht 5' 1.5 (1.562 m)   Wt 185 lb 8 oz (84.1 kg)   SpO2 98%   BMI 34.48 kg/m      Physical Exam Vitals and nursing note reviewed.  Constitutional:      General: She is not in acute distress.    Appearance: Normal appearance.  HENT:     Head: Normocephalic and atraumatic.  Eyes:     General: No scleral icterus.    Conjunctiva/sclera: Conjunctivae normal.  Cardiovascular:     Rate and Rhythm: Normal rate.  Pulmonary:     Effort: Pulmonary effort is normal.  Neurological:     Mental Status: She is alert and oriented to person, place, and time. Mental status is at baseline.  Psychiatric:        Mood and Affect: Mood normal.        Behavior: Behavior normal.     Depression Screen    11/11/2023    2:09 PM 07/11/2015    3:39 PM  PHQ 2/9 Scores  PHQ - 2 Score 1 0  PHQ- 9 Score 7    Results for orders placed or performed in visit on 11/11/23  Comprehensive metabolic panel with GFR  Result Value Ref Range   Glucose 104 (H) 70 - 99 mg/dL   BUN 12 8 - 27 mg/dL   Creatinine, Ser 9.39 0.57 - 1.00 mg/dL   eGFR 897 >40 fO/fpw/8.26   BUN/Creatinine Ratio 20 12 - 28   Sodium 139 134 - 144 mmol/L   Potassium 4.3 3.5 - 5.2 mmol/L   Chloride 103 96 - 106 mmol/L   CO2 21 20 - 29 mmol/L   Calcium  10.2 8.7 - 10.3 mg/dL   Total Protein 7.6 6.0 - 8.5 g/dL   Albumin 5.0 (H) 3.9 - 4.9 g/dL   Globulin, Total 2.6 1.5 - 4.5 g/dL   Bilirubin Total 0.4 0.0 - 1.2 mg/dL   Alkaline Phosphatase 56 49 - 135 IU/L   AST 29 0 - 40 IU/L   ALT 25 0 - 32 IU/L  Hemoglobin A1c  Result Value Ref Range   Hgb  A1c MFr Bld 5.3 4.8 - 5.6 %   Est. average glucose Bld gHb Est-mCnc 105 mg/dL  Lipid panel  Result Value Ref Range   Cholesterol, Total 136 100 - 199 mg/dL   Triglycerides 65 0 - 149 mg/dL   HDL 45 >60 mg/dL   VLDL Cholesterol Cal 13 5 - 40 mg/dL   LDL Chol Calc (NIH) 78 0 - 99 mg/dL   Chol/HDL Ratio 3.0 0.0 - 4.4 ratio  VITAMIN D  25 Hydroxy (Vit-D Deficiency, Fractures)  Result Value Ref Range   Vit D, 25-Hydroxy 26.8 (L) 30.0 - 100.0 ng/mL  TSH Rfx on Abnormal to  Free T4  Result Value Ref Range   TSH 0.863 0.450 - 4.500 uIU/mL  HIV Antibody (routine testing w rflx)  Result Value Ref Range   HIV Screen 4th Generation wRfx Non Reactive Non Reactive  HCV Ab w Reflex to Quant PCR  Result Value Ref Range   HCV Ab Non Reactive Non Reactive  Vitamin B12  Result Value Ref Range   Vitamin B-12 680 232 - 1,245 pg/mL  Iron Binding Cap (TIBC)([facility])  Result Value Ref Range   Total Iron Binding Capacity 412 250 - 450 ug/dL   UIBC 666 881 - 630 ug/dL   Iron 79 27 - 860 ug/dL   Iron Saturation 19 15 - 55 %  Ferritin  Result Value Ref Range   Ferritin 205 (H) 15 - 150 ng/mL  Interpretation:  Result Value Ref Range   HCV Interp 1: Comment     Assessment & Plan     Establishing care with new doctor, encounter for  Generalized anxiety disorder with panic attacks -     ALPRAZolam ; Take 1-2 tablets (0.5-1 mg total) by mouth 2 (two) times daily as needed for anxiety.  Dispense: 120 tablet; Refill: 0  Chronic prescription benzodiazepine use  Hyperlipidemia, unspecified hyperlipidemia type -     Lipid panel -     Atorvastatin  Calcium ; Take 1 tablet (20 mg total) by mouth daily.  Dispense: 90 tablet; Refill: 1 -     Fenofibrate ; Take 1 tablet (145 mg total) by mouth daily.  Dispense: 90 tablet; Refill: 1  Restless leg -     VITAMIN D  25 Hydroxy (Vit-D Deficiency, Fractures) -     TSH Rfx on Abnormal to Free T4 -     Vitamin B12 -     Iron and TIBC -     Ferritin  Arthritis of both knees -     Meloxicam ; Take 1 tablet (15 mg total) by mouth daily.  Dispense: 90 tablet; Refill: 1 -     traMADol  HCl; Take 1 tablet (50 mg total) by mouth 2 (two) times daily as needed.  Dispense: 60 tablet; Refill: 2  Elevated blood-pressure reading without diagnosis of hypertension  Chronic bilateral low back pain with bilateral sciatica -     Cyclobenzaprine  HCl; Take 1 tablet (10 mg total) by mouth 3 (three) times daily as needed for muscle spasms.   Dispense: 63 tablet; Refill: 1 -     Meloxicam ; Take 1 tablet (15 mg total) by mouth daily.  Dispense: 90 tablet; Refill: 1 -     traMADol  HCl; Take 1 tablet (50 mg total) by mouth 2 (two) times daily as needed.  Dispense: 60 tablet; Refill: 2  Muscle spasm of back -     Cyclobenzaprine  HCl; Take 1 tablet (10 mg total) by mouth 3 (three) times daily as needed for muscle spasms.  Dispense: 63 tablet; Refill: 1 -     traMADol  HCl; Take 1 tablet (50 mg total) by mouth 2 (two) times daily as needed.  Dispense: 60 tablet; Refill: 2  History of total right knee replacement -     Meloxicam ; Take 1 tablet (15 mg total) by mouth daily.  Dispense: 90 tablet; Refill: 1 -     traMADol  HCl; Take 1 tablet (50 mg total) by mouth 2 (two) times daily as needed.  Dispense: 60 tablet; Refill: 2  Obesity (BMI 30.0-34.9) -     Hemoglobin A1c  Screening for endocrine, metabolic and immunity disorder -     Comprehensive metabolic panel with GFR -     Hemoglobin A1c  Encounter for hepatitis C screening test for low risk patient -     HCV Ab w Reflex to Quant PCR  Encounter for screening for HIV -     HIV Antibody (routine testing w rflx)  Other orders -     Interpretation:       Establishing care with a new doctor, encounter for  Establishing care with new provider. Discussed general health maintenance, screenings, and vaccinations. - Order blood work including HIV and hepatitis C screening. - Schedule Pap smear. - Plan complete physical examination at subsequent visit. - Discuss tetanus, pneumonia, and shingles vaccines.  Patient declined for today.  Generalized anxiety disorder with panic attacks; chronic prescription benzodiazepine use Chronic anxiety managed with Xanax , transitioning to escitalopram  to reduce dependency. Discussed risks of long-term benzodiazepine use and benefits of SSRI therapy. - Prescribe escitalopram  (Lexapro ). - Continue Xanax  with plan to taper as anxiety stabilizes. -  Schedule follow-up in 4-6 weeks to assess response to escitalopram .  Hyperlipidemia Hyperlipidemia managed with fenofibrate . Discussed dietary influences on cholesterol levels. - Continue fenofibrate .\  Osteoarthritis of right knee; history of total right knee replacement Chronic osteoarthritis managed with meloxicam  and tramadol . - Continue meloxicam . - Continue tramadol .  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  Osteoarthritis of fingers Chronic osteoarthritis in fingers contributing to joint pain.  Restless leg Symptoms suggestive of restless legs syndrome, particularly at night when not taking gabapentin. - Okay to continue gabapentin as prescribed; patient is trying to taper herself off however.     Return in about 6 weeks (around 12/23/2023) for HTN, Anx/Dep, ?Pap.     I discussed the assessment and treatment plan with the patient  The patient was provided an opportunity to ask questions and all were answered. The patient agreed with the plan and demonstrated an understanding of the instructions.   The patient was advised to call back or seek an in-person evaluation if the symptoms worsen or if the condition fails to improve as anticipated.    LAURAINE LOISE BUOY, DO  [facility] [phone] (phone) [phone] (fax)  [facility]

## 2023-11-12 LAB — COMPREHENSIVE METABOLIC PANEL WITH GFR
ALT: 25 IU/L (ref 0–32)
AST: 29 IU/L (ref 0–40)
Albumin: 5 g/dL — ABNORMAL HIGH (ref 3.9–4.9)
Alkaline Phosphatase: 56 IU/L (ref 49–135)
BUN/Creatinine Ratio: 20 (ref 12–28)
BUN: 12 mg/dL (ref 8–27)
Bilirubin Total: 0.4 mg/dL (ref 0.0–1.2)
CO2: 21 mmol/L (ref 20–29)
Calcium: 10.2 mg/dL (ref 8.7–10.3)
Chloride: 103 mmol/L (ref 96–106)
Creatinine, Ser: 0.6 mg/dL (ref 0.57–1.00)
Globulin, Total: 2.6 g/dL (ref 1.5–4.5)
Glucose: 104 mg/dL — ABNORMAL HIGH (ref 70–99)
Potassium: 4.3 mmol/L (ref 3.5–5.2)
Sodium: 139 mmol/L (ref 134–144)
Total Protein: 7.6 g/dL (ref 6.0–8.5)
eGFR: 102 mL/min/1.73 (ref >59)

## 2023-11-12 LAB — HIV ANTIBODY (ROUTINE TESTING W REFLEX): HIV Screen 4th Generation wRfx: NONREACTIVE

## 2023-11-12 LAB — LIPID PANEL
Chol/HDL Ratio: 3 ratio (ref 0.0–4.4)
Cholesterol, Total: 136 mg/dL (ref 100–199)
HDL: 45 mg/dL (ref >39)
LDL Chol Calc (NIH): 78 mg/dL (ref 0–99)
Triglycerides: 65 mg/dL (ref 0–149)
VLDL Cholesterol Cal: 13 mg/dL (ref 5–40)

## 2023-11-12 LAB — IRON AND TIBC
Iron Saturation: 19 % (ref 15–55)
Iron: 79 ug/dL (ref 27–139)
Total Iron Binding Capacity: 412 ug/dL (ref 250–450)
UIBC: 333 ug/dL (ref 118–369)

## 2023-11-12 LAB — VITAMIN B12: Vitamin B-12: 680 pg/mL (ref 232–1245)

## 2023-11-12 LAB — TSH RFX ON ABNORMAL TO FREE T4: TSH: 0.863 u[IU]/mL (ref 0.450–4.500)

## 2023-11-12 LAB — HEMOGLOBIN A1C
Est. average glucose Bld gHb Est-mCnc: 105 mg/dL
Hgb A1c MFr Bld: 5.3 % (ref 4.8–5.6)

## 2023-11-12 LAB — FERRITIN: Ferritin: 205 ng/mL — ABNORMAL HIGH (ref 15–150)

## 2023-11-12 LAB — VITAMIN D 25 HYDROXY (VIT D DEFICIENCY, FRACTURES): Vit D, 25-Hydroxy: 26.8 ng/mL — ABNORMAL LOW (ref 30.0–100.0)

## 2023-11-12 LAB — HCV INTERPRETATION

## 2023-11-12 LAB — HCV AB W REFLEX TO QUANT PCR: HCV Ab: NONREACTIVE

## 2023-11-13 ENCOUNTER — Ambulatory Visit: Payer: Self-pay

## 2023-11-13 DIAGNOSIS — F411 Generalized anxiety disorder: Secondary | ICD-10-CM

## 2023-11-13 NOTE — Telephone Encounter (Signed)
 FYI Only or Action Required?: Action required by provider: update on patient condition.  Patient was last seen in primary care on 11/11/2023 by Donzella Lauraine SAILOR, DO.  Called Nurse Triage reporting Insomnia.  Symptoms began several days ago.  Interventions attempted: OTC medications: melatonin .  Symptoms are: gradually worsening.  Triage Disposition: Home Care  Patient/caregiver understands and will follow disposition?: YesCopied from CRM #[id number]. Topic: Clinical - Red Word Triage >> Nov 13, 2023 11:26 AM Tiffini S wrote: [facility] that prompted transfer to Nurse Triage: Patient was prescribed escitalopram  (LEXAPRO ) 10 MG tablet- took the medication on Tuesday and last night and have not been to bed- have had only 4 hours of sleep Reason for Disposition  Difficulty falling to sleep or staying asleep  Answer Assessment - Initial Assessment Questions 4 hours of sleep since Tuesday. Pt didn't sleep last night. Pt looked up side effects and insomnia is one of them. No appt made at this time. Please advise on next steps.      1. DESCRIPTION: Tell me about your sleeping problem. (e.g., waking frequently during night, sleeping during day and awake at night, trouble falling asleep) How bad is it?      4 hours of sleep since Tuesday.  2. ONSET: How long have you been having trouble sleeping? (e.g., days, weeks, months; longstanding sleep problems)     2 nights 3. DAYTIME SLEEP PATTERN: How much time do you spend sleeping or napping during the day?     denies 4. STRESSORS: Is there anything that is making you feel stressed? Is there something that worries you?     na 5. PAIN: Do you have any pain that is keeping you awake? (e.g., back pain, joint pain) If Yes, ask How bad is the pain? (e.g., scale 0-10; mild, moderate, severe).     denies 6. CAFFEINE: Do you drink caffeinated beverages? If Yes, ask How much each day? (e.g., coffee, tea, colas)     na 7. ALCOHOL USE OR  SUBSTANCE USE: Do you drink alcohol or use any substances?     na 8. MEDICINE CHANGE: Has there been any recent change in medicines? (e.g., new medicine started, stopped, or dose changed).      Pt started Lexapro  on Tuesday  9. TREATMENT: What have you done so far to treat this sleep problem? (e.g., prescription or OTC sleep medicines, herbal or dietary supplements, cannabis, relaxation strategies)     Pt takes  10mg  melatonin nightly  10. OTHER SYMPTOMS: Do you have any other symptoms?  (e.g., difficulty breathing)       denies  Protocols used: Insomnia-A-AH

## 2023-11-14 ENCOUNTER — Other Ambulatory Visit: Payer: Self-pay | Admitting: Family Medicine

## 2023-11-14 ENCOUNTER — Encounter: Payer: Self-pay | Admitting: Family Medicine

## 2023-11-14 DIAGNOSIS — F410 Panic disorder [episodic paroxysmal anxiety] without agoraphobia: Secondary | ICD-10-CM

## 2023-11-14 MED ORDER — FLUOXETINE HCL 20 MG PO CAPS: 20.0000 mg | ORAL_CAPSULE | Freq: Every day | ORAL | 1 refills | Status: DC

## 2023-11-14 NOTE — Telephone Encounter (Signed)
 Detailed vm left per DPR. Ok to advise if call is returned

## 2023-11-21 ENCOUNTER — Ambulatory Visit: Payer: Self-pay | Admitting: Family Medicine

## 2023-12-24 ENCOUNTER — Ambulatory Visit (INDEPENDENT_AMBULATORY_CARE_PROVIDER_SITE_OTHER): Admitting: Family Medicine

## 2023-12-24 ENCOUNTER — Encounter: Payer: Self-pay | Admitting: Family Medicine

## 2023-12-24 ENCOUNTER — Other Ambulatory Visit (HOSPITAL_COMMUNITY)
Admission: RE | Admit: 2023-12-24 | Discharge: 2023-12-24 | Disposition: A | Discharge status: Home / Self Care | Source: Ambulatory Visit | Attending: Family Medicine | Admitting: Family Medicine

## 2023-12-24 VITALS — BP 127/88 | HR 67 | Temp 98.0°F | Ht 61.5 in | Wt 180.6 lb

## 2023-12-24 DIAGNOSIS — E559 Vitamin D deficiency, unspecified: Secondary | ICD-10-CM

## 2023-12-24 DIAGNOSIS — R030 Elevated blood-pressure reading, without diagnosis of hypertension: Secondary | ICD-10-CM | POA: Diagnosis not present

## 2023-12-24 DIAGNOSIS — F411 Generalized anxiety disorder: Secondary | ICD-10-CM

## 2023-12-24 DIAGNOSIS — Z124 Encounter for screening for malignant neoplasm of cervix: Secondary | ICD-10-CM | POA: Diagnosis not present

## 2023-12-24 DIAGNOSIS — Z716 Tobacco abuse counseling: Secondary | ICD-10-CM

## 2023-12-24 DIAGNOSIS — F17200 Nicotine dependence, unspecified, uncomplicated: Secondary | ICD-10-CM | POA: Diagnosis not present

## 2023-12-24 DIAGNOSIS — N393 Stress incontinence (female) (male): Secondary | ICD-10-CM

## 2023-12-24 NOTE — Assessment & Plan Note (Signed)
 On fluoxetine  20 mg daily. Continue unchanged per patient preference. Titrating down xanax  use then will reassess at follow up visit. Has 62 xanax  left per her count this morning. Discussed intent to avoid daily xanax  long-term.

## 2023-12-24 NOTE — Progress Notes (Signed)
 Established patient visit   Patient: Victoria Bautista   DOB: 1960/10/14   63 y.o. Female  MRN: [id number] Visit Date: 12/24/2023  Today's healthcare provider: LAURAINE LOISE BUOY, DO   Chief Complaint  Patient presents with   Medical Management of Chronic Issues    Patient is here today for hypertension, depression/anxiety, and possibly a pap if time allows.  States that she went to check her blood pressure this morning at a local pharmacy and was 156/91 P- 71. Depression and anxiety states that she is not doing very good with it right now.  Worried about son about to be homeless.  Tdap- yes   Subjective    HPI Victoria Bautista is a 63 year old female who presents for follow up on blood pressure management and anxiety, as well as for her Pap smear.  She has been experiencing somewhat elevated blood pressure readings at home (taken sporadically). She is not currently on any medication for blood pressure. She acknowledges that smoking affects her blood pressure and states she intentionally did not smoke before today's appointment.  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 She smokes two cigarettes and eats a small snack during work breaks. At home, she admits to eating more candy than she should.  She is experiencing increased depression and anxiety, primarily due to concerns about her son potentially becoming homeless. Her son, who lives in [place], is facing eviction, and she is worried about him, his girlfriend, and her children. She has considered offering them a place to stay, despite space constraints at her home.  She reports poor sleep quality, often taking melatonin but finding it takes several hours to fall asleep. She frequently wakes up in the early morning hours and struggles to return to sleep. She is currently taking fluoxetine  and as-needed Xanax . She has been taking the Xanax  daily, with occasional days  requiring two doses. She has 62 pills left from a prescription of 120.  No vaginal itching, discharge, or odor. She attributes any discomfort to bladder leakage for which she wears a pad. She is not currently sexually active and has not been for three years.      Medications: Outpatient Medications Prior to Visit  Medication Sig   acetaminophen  (TYLENOL  ARTHRITIS PAIN) 650 MG CR tablet Take 1,950 mg by mouth every 8 (eight) hours as needed for pain.   ALPRAZolam  (XANAX ) 0.5 MG tablet Take 1-2 tablets (0.5-1 mg total) by mouth 2 (two) times daily as needed for anxiety.   atorvastatin  (LIPITOR) 20 MG tablet Take 1 tablet (20 mg total) by mouth daily.   Black Cohosh 40 MG CAPS Take 80 mg by mouth at bedtime.   calcium  carbonate (OS-CAL - DOSED IN MG OF ELEMENTAL CALCIUM ) 1250 (500 Ca) MG tablet Take 1,250 mg by mouth daily.   cholecalciferol (VITAMIN D ) 1000 units tablet Take 2,000 Units by mouth daily. (Patient taking differently: Take 5,000 Units by mouth daily.)   Coenzyme Q10 (COQ10) 100 MG CAPS Take 1 capsule by mouth daily.   cyclobenzaprine  (FLEXERIL ) 10 MG tablet Take 1 tablet (10 mg total) by mouth 3 (three) times daily as needed for muscle spasms.   fenofibrate  (TRICOR ) 145 MG tablet Take 1 tablet (145 mg total) by mouth daily.   FLUoxetine  (PROZAC ) 20 MG capsule Take 1 capsule (20 mg total) by mouth daily.   gabapentin (NEURONTIN) 100 MG capsule Take 100 mg by  mouth 2 (two) times daily. In the morning and afternoon   gabapentin (NEURONTIN) 300 MG capsule Take 600 mg by mouth at bedtime.   Glucosamine-Chondroitin (OSTEO BI-FLEX REGULAR STRENGTH PO) Take 2 tablets by mouth daily.    Melatonin 10 MG TABS Take 10 mg by mouth at bedtime.   meloxicam  (MOBIC ) 15 MG tablet Take 1 tablet (15 mg total) by mouth daily.   Multiple Vitamin (MULTIVITAMIN) tablet Take 2 tablets by mouth daily.    OMEGA 3-6-9 FATTY ACIDS PO Take 1 capsule by mouth daily.   traMADol  (ULTRAM ) 50 MG tablet Take 1  tablet (50 mg total) by mouth 2 (two) times daily as needed.   No facility-administered medications prior to visit.        Objective    BP 127/88 (BP Location: Left Arm, Patient Position: Sitting, Cuff Size: Normal)   Pulse 67   Temp 98 F (36.7 C) (Oral)   Ht 5' 1.5 (1.562 m)   Wt 180 lb 9.6 oz (81.9 kg)   SpO2 98%   BMI 33.57 kg/m     Physical Exam Vitals and nursing note reviewed.  Constitutional:      General: She is not in acute distress.    Appearance: Normal appearance.  HENT:     Head: Normocephalic and atraumatic.  Eyes:     General: No scleral icterus.    Conjunctiva/sclera: Conjunctivae normal.  Cardiovascular:     Rate and Rhythm: Normal rate.  Pulmonary:     Effort: Pulmonary effort is normal.  Genitourinary:    General: Normal vulva.     Pubic Area: No rash.      Tanner stage (genital): 5.     Labia:        Right: No rash, tenderness, lesion or injury.        Left: No rash, tenderness, lesion or injury.      Vagina: Normal.     Cervix: Normal.     Uterus: Normal.      Adnexa: Right adnexa normal and left adnexa normal.  Neurological:     Mental Status: She is alert and oriented to person, place, and time. Mental status is at baseline.  Psychiatric:        Mood and Affect: Mood normal.        Behavior: Behavior normal.      No results found for any visits on 12/24/23.  Assessment & Plan    GAD (generalized anxiety disorder) Assessment & Plan: On fluoxetine  20 mg daily. Continue unchanged per patient preference. Titrating down xanax  use then will reassess at follow up visit. Has 62 xanax  left per her count this morning. Discussed intent to avoid daily xanax  long-term.   Elevated blood-pressure reading without diagnosis of hypertension  Pap smear for cervical cancer screening -     Cytology - PAP  Nicotine dependence with current use  Encounter for smoking cessation counseling  Vitamin D  deficiency -     VITAMIN D  25 Hydroxy (Vit-D  Deficiency, Fractures)  Stress incontinence of urine     GAD (generalized anxiety disorder) Increased anxiety due to son's potential homelessness. Daily Xanax  use poses risks. - Continue transition toward minimizing Xanax  use; reserve for panic episodes. - Consider halving Xanax  tablets while trying to reduce dosage. - Maintain current fluoxetine  dosage.  Consider increase at follow-up if needed  Elevated blood-pressure reading without diagnosis of hypertension Suboptimal home readings, likely influenced by smoking. No antihypertensive medication.  Pap smear for cervical cancer screening  Pap smear performed and sample obtained.  Nicotine dependence with current use; encounter for smoking cessation counseling Continues smoking for stress relief, affecting blood pressure.  Patient is not interested in quitting at this time.  Stress incontinence of urine Experiences incontinence with coughing, managed with pads.  No acute concerns.  Continue to monitor.   General Health Maintenance Due for tetanus vaccine and Pap smear. - Administer tetanus vaccine. - Perform Pap smear.    Return in about 4 weeks (around 01/21/2024) for Anx/Dep.      I discussed the assessment and treatment plan with the patient  The patient was provided an opportunity to ask questions and all were answered. The patient agreed with the plan and demonstrated an understanding of the instructions.   The patient was advised to call back or seek an in-person evaluation if the symptoms worsen or if the condition fails to improve as anticipated.    LAURAINE LOISE BUOY, DO  [facility] [phone] (phone) [phone] (fax)  [facility]

## 2023-12-25 ENCOUNTER — Other Ambulatory Visit (HOSPITAL_COMMUNITY): Payer: Self-pay

## 2023-12-25 ENCOUNTER — Telehealth: Payer: Self-pay | Admitting: Pharmacy Technician

## 2023-12-25 LAB — VITAMIN D 25 HYDROXY (VIT D DEFICIENCY, FRACTURES): Vit D, 25-Hydroxy: 27.3 ng/mL — ABNORMAL LOW (ref 30.0–100.0)

## 2023-12-25 NOTE — Telephone Encounter (Signed)
 Pharmacy Patient Advocate Encounter   Received notification from Onbase that prior authorization for traMADol  HCl 50MG  tablets is required/requested.   Insurance verification completed.   The patient is insured through [facility] MEDICAID.   Per test claim: PA required; PA submitted to above mentioned insurance via Latent Key/confirmation #/EOC [facility] Status is pending

## 2023-12-26 ENCOUNTER — Other Ambulatory Visit (HOSPITAL_COMMUNITY): Payer: Self-pay

## 2023-12-26 LAB — CYTOLOGY - PAP
Comment: NEGATIVE
Diagnosis: NEGATIVE
High risk HPV: NEGATIVE

## 2023-12-26 NOTE — Telephone Encounter (Signed)
 Pharmacy Patient Advocate Encounter  Received notification from [facility] MEDICAID that Prior Authorization for  traMADol  HCl 50MG  tablets has been APPROVED from 12/25/2023 to 06/24/2024. Ran test claim, Copay is $4.00. This test claim was processed through [facility]- copay amounts may vary at other pharmacies due to pharmacy/plan contracts, or as the patient moves through the different stages of their insurance plan.   PA #/Case ID/Reference #: [id number]

## 2023-12-29 ENCOUNTER — Other Ambulatory Visit (HOSPITAL_COMMUNITY): Payer: Self-pay

## 2023-12-31 ENCOUNTER — Other Ambulatory Visit: Payer: Self-pay | Admitting: Family Medicine

## 2023-12-31 ENCOUNTER — Telehealth: Payer: Self-pay

## 2023-12-31 DIAGNOSIS — M62830 Muscle spasm of back: Secondary | ICD-10-CM

## 2023-12-31 DIAGNOSIS — G8929 Other chronic pain: Secondary | ICD-10-CM

## 2023-12-31 NOTE — Telephone Encounter (Signed)
 FYI   Copied from CRM [id number]. Topic: Appointments - Transfer of Care >> Dec 31, 2023  1:19 PM Lauren C wrote: Pt is requesting to transfer FROM: Lauraine Buoy Pt is requesting to transfer TO: Darice Petty Reason for requested transfer: Difference of opinion.  It is the responsibility of the team the patient would like to transfer to Elray Petty) to reach out to the patient if for any reason this transfer is not acceptable.

## 2024-01-01 ENCOUNTER — Ambulatory Visit: Payer: Self-pay | Admitting: Family Medicine

## 2024-01-05 NOTE — Telephone Encounter (Signed)
 Called patient to inform her of the results from her pap, printed and will be sending results to address on file.

## 2024-01-14 ENCOUNTER — Other Ambulatory Visit: Payer: Self-pay | Admitting: Family Medicine

## 2024-01-14 DIAGNOSIS — F411 Generalized anxiety disorder: Secondary | ICD-10-CM

## 2024-01-21 ENCOUNTER — Ambulatory Visit: Admitting: Family Medicine

## 2024-02-25 ENCOUNTER — Other Ambulatory Visit: Payer: Self-pay | Admitting: Family Medicine

## 2024-02-25 DIAGNOSIS — M62830 Muscle spasm of back: Secondary | ICD-10-CM

## 2024-02-25 DIAGNOSIS — G8929 Other chronic pain: Secondary | ICD-10-CM

## 2024-02-25 NOTE — Telephone Encounter (Signed)
 Requested medication (s) are due for refill today: Yes  Requested medication (s) are on the active medication list: Yes  Last refill:  01/02/24  Future visit scheduled: Yes  Notes to clinic:  Unable to refill per protocol, cannot delegate.      Requested Prescriptions  Pending Prescriptions Disp Refills   cyclobenzaprine  (FLEXERIL ) 10 MG tablet [Pharmacy Med Name: CYCLOBENZAPRINE  HCL 10 MG TAB] 63 tablet 1    Sig: TAKE 1 TABLET BY MOUTH 3 TIMES DAILY AS NEEDED FOR MUSCLE SPASMS     Not Delegated - Analgesics:  Muscle Relaxants Failed - 02/25/2024  4:38 PM      Failed - This refill cannot be delegated      Passed - Valid encounter within last 6 months    Recent Outpatient Visits           2 months ago GAD (generalized anxiety disorder)   [facility] [place], Lauraine SAILOR, DO   3 months ago Establishing care with new doctor, encounter for   [facility], Lauraine SAILOR, DO

## 2024-03-11 ENCOUNTER — Other Ambulatory Visit: Payer: Self-pay | Admitting: Family Medicine

## 2024-03-11 DIAGNOSIS — F410 Panic disorder [episodic paroxysmal anxiety] without agoraphobia: Secondary | ICD-10-CM

## 2024-04-01 ENCOUNTER — Other Ambulatory Visit: Payer: Self-pay | Admitting: Family Medicine

## 2024-04-01 DIAGNOSIS — Z96651 Presence of right artificial knee joint: Secondary | ICD-10-CM

## 2024-04-01 DIAGNOSIS — M62830 Muscle spasm of back: Secondary | ICD-10-CM

## 2024-04-01 DIAGNOSIS — G8929 Other chronic pain: Secondary | ICD-10-CM

## 2024-04-01 DIAGNOSIS — M170 Bilateral primary osteoarthritis of knee: Secondary | ICD-10-CM

## 2024-04-21 ENCOUNTER — Encounter: Admitting: Nurse Practitioner
